# Patient Record
Sex: Male | Born: 1935 | Race: White | Hispanic: No | Marital: Married | Smoking: Former smoker
Health system: Southern US, Community
[De-identification: ages and names within clinical notes are randomized; demographics above are authoritative.]

## PROBLEM LIST (undated history)

## (undated) DIAGNOSIS — K219 Gastro-esophageal reflux disease without esophagitis: Secondary | ICD-10-CM

## (undated) DIAGNOSIS — M1711 Unilateral primary osteoarthritis, right knee: Secondary | ICD-10-CM

## (undated) DIAGNOSIS — N429 Disorder of prostate, unspecified: Secondary | ICD-10-CM

## (undated) DIAGNOSIS — K649 Unspecified hemorrhoids: Secondary | ICD-10-CM

## (undated) DIAGNOSIS — I10 Essential (primary) hypertension: Secondary | ICD-10-CM

## (undated) DIAGNOSIS — R339 Retention of urine, unspecified: Secondary | ICD-10-CM

## (undated) DIAGNOSIS — F5101 Primary insomnia: Secondary | ICD-10-CM

## (undated) DIAGNOSIS — I499 Cardiac arrhythmia, unspecified: Secondary | ICD-10-CM

## (undated) DIAGNOSIS — N529 Male erectile dysfunction, unspecified: Secondary | ICD-10-CM

## (undated) DIAGNOSIS — D075 Carcinoma in situ of prostate: Secondary | ICD-10-CM

## (undated) DIAGNOSIS — N401 Enlarged prostate with lower urinary tract symptoms: Secondary | ICD-10-CM

## (undated) DIAGNOSIS — C801 Malignant (primary) neoplasm, unspecified: Secondary | ICD-10-CM

## (undated) DIAGNOSIS — C44301 Unspecified malignant neoplasm of skin of nose: Secondary | ICD-10-CM

## (undated) DIAGNOSIS — E785 Hyperlipidemia, unspecified: Secondary | ICD-10-CM

## (undated) DIAGNOSIS — R972 Elevated prostate specific antigen [PSA]: Secondary | ICD-10-CM

## (undated) DIAGNOSIS — N138 Other obstructive and reflux uropathy: Secondary | ICD-10-CM

## (undated) HISTORY — DX: Gastro-esophageal reflux disease without esophagitis: K21.9

## (undated) HISTORY — DX: Other obstructive and reflux uropathy: N13.8

## (undated) HISTORY — DX: Primary insomnia: F51.01

## (undated) HISTORY — DX: Elevated prostate specific antigen (PSA): R97.20

## (undated) HISTORY — DX: Retention of urine, unspecified: R33.9

## (undated) HISTORY — DX: Carcinoma in situ of prostate: D07.5

## (undated) HISTORY — PX: TONSILLECTOMY: SUR1361

## (undated) HISTORY — PX: SQUAMOUS CELL CARCINOMA EXCISION: SHX2433

## (undated) HISTORY — DX: Unilateral primary osteoarthritis, right knee: M17.11

## (undated) HISTORY — DX: Male erectile dysfunction, unspecified: N52.9

## (undated) HISTORY — DX: Essential (primary) hypertension: I10

## (undated) HISTORY — PX: CHOLECYSTECTOMY: SHX55

## (undated) HISTORY — DX: Benign prostatic hyperplasia with lower urinary tract symptoms: N40.1

## (undated) HISTORY — DX: Unspecified malignant neoplasm of skin of nose: C44.301

---

## 2003-12-08 ENCOUNTER — Other Ambulatory Visit: Payer: Self-pay

## 2009-08-11 ENCOUNTER — Ambulatory Visit: Payer: Self-pay | Admitting: Otolaryngology

## 2009-11-23 ENCOUNTER — Ambulatory Visit: Payer: Self-pay | Admitting: Urology

## 2009-11-30 ENCOUNTER — Ambulatory Visit: Payer: Self-pay | Admitting: Urology

## 2012-01-01 ENCOUNTER — Ambulatory Visit: Payer: Self-pay | Admitting: Gastroenterology

## 2012-02-15 ENCOUNTER — Ambulatory Visit: Payer: Self-pay | Admitting: Gastroenterology

## 2013-08-01 DIAGNOSIS — N529 Male erectile dysfunction, unspecified: Secondary | ICD-10-CM

## 2013-08-01 DIAGNOSIS — D075 Carcinoma in situ of prostate: Secondary | ICD-10-CM

## 2013-08-01 DIAGNOSIS — C44301 Unspecified malignant neoplasm of skin of nose: Secondary | ICD-10-CM

## 2013-08-01 DIAGNOSIS — R972 Elevated prostate specific antigen [PSA]: Secondary | ICD-10-CM

## 2013-08-01 DIAGNOSIS — N138 Other obstructive and reflux uropathy: Secondary | ICD-10-CM

## 2013-08-01 DIAGNOSIS — Z87898 Personal history of other specified conditions: Secondary | ICD-10-CM | POA: Insufficient documentation

## 2013-08-01 HISTORY — DX: Unspecified malignant neoplasm of skin of nose: C44.301

## 2013-08-01 HISTORY — DX: Other obstructive and reflux uropathy: N13.8

## 2013-08-01 HISTORY — DX: Male erectile dysfunction, unspecified: N52.9

## 2013-08-01 HISTORY — DX: Carcinoma in situ of prostate: D07.5

## 2013-08-01 HISTORY — DX: Elevated prostate specific antigen (PSA): R97.20

## 2014-07-09 DIAGNOSIS — F5101 Primary insomnia: Secondary | ICD-10-CM | POA: Insufficient documentation

## 2014-07-09 DIAGNOSIS — I10 Essential (primary) hypertension: Secondary | ICD-10-CM

## 2014-07-09 DIAGNOSIS — E785 Hyperlipidemia, unspecified: Secondary | ICD-10-CM | POA: Insufficient documentation

## 2014-07-09 DIAGNOSIS — K219 Gastro-esophageal reflux disease without esophagitis: Secondary | ICD-10-CM

## 2014-07-09 HISTORY — DX: Essential (primary) hypertension: I10

## 2014-07-09 HISTORY — DX: Gastro-esophageal reflux disease without esophagitis: K21.9

## 2014-07-09 HISTORY — DX: Primary insomnia: F51.01

## 2015-09-14 DIAGNOSIS — R339 Retention of urine, unspecified: Secondary | ICD-10-CM

## 2015-09-14 HISTORY — DX: Retention of urine, unspecified: R33.9

## 2016-07-03 ENCOUNTER — Other Ambulatory Visit: Payer: Self-pay | Admitting: Gastroenterology

## 2016-07-03 DIAGNOSIS — R1310 Dysphagia, unspecified: Secondary | ICD-10-CM

## 2016-07-07 ENCOUNTER — Ambulatory Visit
Admission: RE | Admit: 2016-07-07 | Discharge: 2016-07-07 | Disposition: A | Discharge status: Home / Self Care | Payer: Medicare Other | Source: Ambulatory Visit | Attending: Gastroenterology | Admitting: Gastroenterology

## 2016-07-07 DIAGNOSIS — R1310 Dysphagia, unspecified: Secondary | ICD-10-CM

## 2016-07-07 DIAGNOSIS — K449 Diaphragmatic hernia without obstruction or gangrene: Secondary | ICD-10-CM | POA: Diagnosis not present

## 2016-08-24 ENCOUNTER — Encounter: Payer: Self-pay | Admitting: *Deleted

## 2016-08-25 ENCOUNTER — Ambulatory Visit: Payer: Medicare Other | Admitting: Anesthesiology

## 2016-08-25 ENCOUNTER — Ambulatory Visit
Admission: RE | Admit: 2016-08-25 | Discharge: 2016-08-25 | Disposition: A | Discharge status: Home / Self Care | Payer: Medicare Other | Source: Ambulatory Visit | Attending: Gastroenterology | Admitting: Gastroenterology

## 2016-08-25 ENCOUNTER — Encounter: Payer: Self-pay | Admitting: *Deleted

## 2016-08-25 ENCOUNTER — Encounter
Admission: RE | Disposition: A | Discharge status: Home / Self Care | Payer: Self-pay | Source: Ambulatory Visit | Attending: Gastroenterology

## 2016-08-25 DIAGNOSIS — Z79899 Other long term (current) drug therapy: Secondary | ICD-10-CM | POA: Insufficient documentation

## 2016-08-25 DIAGNOSIS — K219 Gastro-esophageal reflux disease without esophagitis: Secondary | ICD-10-CM | POA: Insufficient documentation

## 2016-08-25 DIAGNOSIS — K449 Diaphragmatic hernia without obstruction or gangrene: Secondary | ICD-10-CM | POA: Diagnosis not present

## 2016-08-25 DIAGNOSIS — E785 Hyperlipidemia, unspecified: Secondary | ICD-10-CM | POA: Diagnosis not present

## 2016-08-25 DIAGNOSIS — Q399 Congenital malformation of esophagus, unspecified: Secondary | ICD-10-CM | POA: Diagnosis not present

## 2016-08-25 DIAGNOSIS — Z87891 Personal history of nicotine dependence: Secondary | ICD-10-CM | POA: Insufficient documentation

## 2016-08-25 DIAGNOSIS — Z7982 Long term (current) use of aspirin: Secondary | ICD-10-CM | POA: Insufficient documentation

## 2016-08-25 DIAGNOSIS — K2950 Unspecified chronic gastritis without bleeding: Secondary | ICD-10-CM | POA: Diagnosis not present

## 2016-08-25 DIAGNOSIS — K224 Dyskinesia of esophagus: Secondary | ICD-10-CM | POA: Diagnosis not present

## 2016-08-25 DIAGNOSIS — K317 Polyp of stomach and duodenum: Secondary | ICD-10-CM | POA: Diagnosis not present

## 2016-08-25 DIAGNOSIS — R1013 Epigastric pain: Secondary | ICD-10-CM | POA: Diagnosis present

## 2016-08-25 HISTORY — DX: Disorder of prostate, unspecified: N42.9

## 2016-08-25 HISTORY — DX: Cardiac arrhythmia, unspecified: I49.9

## 2016-08-25 HISTORY — DX: Male erectile dysfunction, unspecified: N52.9

## 2016-08-25 HISTORY — DX: Gastro-esophageal reflux disease without esophagitis: K21.9

## 2016-08-25 HISTORY — DX: Unspecified hemorrhoids: K64.9

## 2016-08-25 HISTORY — DX: Malignant (primary) neoplasm, unspecified: C80.1

## 2016-08-25 HISTORY — PX: ESOPHAGOGASTRODUODENOSCOPY (EGD) WITH PROPOFOL: SHX5813

## 2016-08-25 HISTORY — DX: Hyperlipidemia, unspecified: E78.5

## 2016-08-25 SURGERY — ESOPHAGOGASTRODUODENOSCOPY (EGD) WITH PROPOFOL
Anesthesia: General

## 2016-08-25 MED ORDER — EPHEDRINE SULFATE-NACL 50-0.9 MG/10ML-% IV SOSY
PREFILLED_SYRINGE | INTRAVENOUS | Status: DC | PRN
  Administered 2016-08-25: 10 mg via INTRAVENOUS

## 2016-08-25 MED ORDER — PROPOFOL 10 MG/ML IV BOLUS
INTRAVENOUS | Status: DC | PRN
  Administered 2016-08-25 (×2): 20 mg via INTRAVENOUS
  Administered 2016-08-25: 80 mg via INTRAVENOUS
  Administered 2016-08-25: 10 mg via INTRAVENOUS

## 2016-08-25 MED ORDER — SODIUM CHLORIDE 0.9 % IV SOLN
INTRAVENOUS | Status: DC
  Administered 2016-08-25: 08:00:00 via INTRAVENOUS

## 2016-08-25 MED ORDER — FENTANYL CITRATE (PF) 100 MCG/2ML IJ SOLN
INTRAMUSCULAR | Status: DC | PRN
  Administered 2016-08-25: 50 ug via INTRAVENOUS

## 2016-08-25 MED ORDER — SODIUM CHLORIDE 0.9 % IV SOLN: INTRAVENOUS | Status: DC

## 2016-08-25 NOTE — H&P (Signed)
Outpatient short stay form Pre-procedure 08/25/2016 8:47 AM Russell Sails MD  Primary Physician: [name]  Reason for visit:  EGD  History of present illness:  Patient is a 80 year old male presenting today as above. He has a history of an atypical epigastric symptoms began this past August. This feels like a gnawing sensation not quite nausea. There has been some discomfort with it. He has had no emesis. Although he did undergo a barium swallow for dysphagia he denies this symptom currently. He has been taking a proton pump inhibitor, omeprazole, 40 mg a day and this has been started to help with the epigastric discomfort as well. He's had no weight loss. His appetite is been good. His barium swallow done on 07/07/2016 showed a normal passage of barium tablet. There were prominent tertiary esophageal contractions consistent with presbyesophagus. A small hiatal hernia and no reflux no obstruction and no evidence of a fixed stricture.    Current Facility-Administered Medications:  .  0.9 %  sodium chloride infusion, , Intravenous, Continuous, Russell Sails, MD, Last Rate: 20 mL/hr at 08/25/16 0825 .  0.9 %  sodium chloride infusion, , Intravenous, Continuous, Russell Sails, MD  Prescriptions Prior to Admission  Medication Sig Dispense Refill Last Dose  . aspirin EC 81 MG tablet Take 81 mg by mouth daily.   08/20/2016  . dutasteride (AVODART) 0.5 MG capsule Take 0.5 mg by mouth daily.   08/24/2016 at Unknown time  . losartan (COZAAR) 50 MG tablet Take 50 mg by mouth daily.   08/25/2016 at 0500  . niacin (NIASPAN) 1000 MG CR tablet Take 1,000 mg by mouth at bedtime.   08/24/2016 at Unknown time  . omeprazole (PRILOSEC) 20 MG capsule Take 20 mg by mouth 2 (two) times daily before a meal.   08/24/2016 at Unknown time     No Known Allergies   Past Medical History:  Diagnosis Date  . Benign prostatic disease   . Cancer ([place])   . Dysrhythmia   . Erectile dysfunction    . GERD (gastroesophageal reflux disease)   . Hemorrhoids   . Hyperlipidemia     Review of systems:      Physical Exam    Heart and lungs: Regular rate and rhythm without rub or gallop, lungs are bilaterally clear.    HEENT: Normocephalic atraumatic eyes are anicteric    Other:     Pertinant exam for procedure: Soft nontender nondistended bowel sounds positive normoactive.    Planned proceedures: EGD and indicated procedures. I have discussed the risks benefits and complications of procedures to include not limited to bleeding, infection, perforation and the risk of sedation and the patient wishes to proceed.    Russell Sails, MD Gastroenterology 08/25/2016  8:47 AM

## 2016-08-25 NOTE — Op Note (Signed)
[facility] Gastroenterology Patient Name: Russell Jordan Procedure Date: 08/25/2016 8:47 AM MRN: [id number] Account #: [id number] Date of Birth: March 18, 1936 Admit Type: Outpatient Age: 80 Room: [facility] ENDO ROOM 1 Gender: Male Note Status: Finalized Procedure:            Upper GI endoscopy Indications:          Epigastric abdominal pain, Dyspepsia, Dysphagia,                        Failure to respond to medical treatment Providers:            [name], MD Referring MD:         No Local Md, MD (Referring MD) Medicines:            Monitored Anesthesia Care Complications:        No immediate complications. Procedure:            Pre-Anesthesia Assessment:                       - ASA Grade Assessment: III - A patient with severe                        systemic disease.                       After obtaining informed consent, the endoscope was                        passed under direct vision. Throughout the procedure,                        the patient's blood pressure, pulse, and oxygen                        saturations were monitored continuously. The Endoscope                        was introduced through the mouth, and advanced to the                        third part of duodenum. The upper GI endoscopy was                        accomplished without difficulty. The patient tolerated                        the procedure well. The patient tolerated the procedure                        well. Findings:      The Z-line was variable. Biopsies were taken with a cold forceps for       histology.      The lower third of the esophagus was significantly tortuous.      Abnormal motility was noted in the middle third of the esophagus and in       the lower third of the esophagus. The cricopharyngeus was normal. There       are extra peristaltic waves in the esophageal body. The distal       esophagus/lower esophageal sphincter is open. Tertiary peristaltic waves   are noted.  A single 1 mm sessile polyp with no bleeding and no stigmata of recent       bleeding was found in the gastric body. The polyp was removed with a       cold biopsy forceps. Resection and retrieval were complete.      A benign smooth appearing polyp is noted just below the GE junction in       the cardia, fundic gland in appearance that I could not access in       forward or retroflex position.      The exam of the stomach was otherwise normal.      Biopsies were taken with a cold forceps in the gastric body and in the       gastric antrum for histology.      Patchy mild mucosal variance characterized by smoothness and altered       texture was found in the second portion of the duodenum and in the third       portion of the duodenum. Biopsies were taken with a cold forceps for       histology. Impression:           - Z-line variable. Biopsied.                       - Tortuous esophagus.                       - Abnormal esophageal motility, consistent with                        presbyesophagus.                       - A single gastric polyp. Resected and retrieved.                       - Mucosal variant in the duodenum. Biopsied.                       - Biopsies were taken with a cold forceps for histology                        in the gastric body and in the gastric antrum. Recommendation:       - Discharge patient to home.                       - Return to GI clinic in 3 weeks.                       - Continue present medications. Procedure Code(s):    --- Professional ---                       [phone], Esophagogastroduodenoscopy, flexible, transoral;                        with biopsy, single or multiple Diagnosis Code(s):    --- Professional ---                       K22.8, Other specified diseases of esophagus                       Q39.9, Congenital malformation  of esophagus, unspecified                       K22.4, Dyskinesia of esophagus                        K31.7, Polyp of stomach and duodenum                       K31.89, Other diseases of stomach and duodenum                       R10.13, Epigastric pain                       R13.10, Dysphagia, unspecified CPT copyright 2016 American Medical Association. All rights reserved. The codes documented in this report are preliminary and upon coder review may  be revised to meet current compliance requirements. [name], MD 08/25/2016 9:18:28 AM This report has been signed electronically. Number of Addenda: 0 Note Initiated On: 08/25/2016 8:47 AM      [facility]

## 2016-08-25 NOTE — Transfer of Care (Signed)
Immediate Anesthesia Transfer of Care Note  Patient: Russell Jordan  Procedure(s) Performed: Procedure(s): ESOPHAGOGASTRODUODENOSCOPY (EGD) WITH PROPOFOL (N/A)  Patient Location: PACU and Endoscopy Unit  Anesthesia Type:General  Level of Consciousness: awake, alert  and oriented  Airway & Oxygen Therapy: Patient Spontanous Breathing and Patient connected to nasal cannula oxygen  Post-op Assessment: Report given to RN and Post -op Vital signs reviewed and stable  Post vital signs: Reviewed and stable  Last Vitals:  Vitals:   08/25/16 0914 08/25/16 0915  BP: (!) 105/40 (!) 105/40  Pulse:  68  Resp:  18  Temp: 36.4 C     Last Pain:  Vitals:   08/25/16 0806  TempSrc: Oral         Complications: No apparent anesthesia complications

## 2016-08-25 NOTE — Anesthesia Postprocedure Evaluation (Signed)
Anesthesia Post Note  Patient: Russell Jordan  Procedure(s) Performed: Procedure(s) (LRB): ESOPHAGOGASTRODUODENOSCOPY (EGD) WITH PROPOFOL (N/A)  Patient location during evaluation: Endoscopy Anesthesia Type: General Level of consciousness: awake and alert Pain management: pain level controlled Vital Signs Assessment: post-procedure vital signs reviewed and stable Respiratory status: spontaneous breathing and respiratory function stable Cardiovascular status: stable Anesthetic complications: no    Last Vitals:  Vitals:   08/25/16 0940 08/25/16 0950  BP: (!) 151/67 (!) 145/59  Pulse:  66  Resp:  13  Temp:      Last Pain:  Vitals:   08/25/16 0806  TempSrc: Oral                 Kenesha Moshier K

## 2016-08-25 NOTE — Anesthesia Preprocedure Evaluation (Signed)
Anesthesia Evaluation  Patient identified by MRN, date of birth, ID band Patient awake    Reviewed: Allergy & Precautions, NPO status , Patient's Chart, lab work & pertinent test results  History of Anesthesia Complications Negative for: history of anesthetic complications  Airway Mallampati: I       Dental   Pulmonary neg pulmonary ROS, former smoker,           Cardiovascular negative cardio ROS       Neuro/Psych negative neurological ROS     GI/Hepatic Neg liver ROS, GERD  Medicated,  Endo/Other  negative endocrine ROS  Renal/GU negative Renal ROS     Musculoskeletal   Abdominal   Peds  Hematology negative hematology ROS (+)   Anesthesia Other Findings   Reproductive/Obstetrics                             Anesthesia Physical Anesthesia Plan  ASA: II  Anesthesia Plan: General   Post-op Pain Management:    Induction:   Airway Management Planned: Nasal Cannula  Additional Equipment:   Intra-op Plan:   Post-operative Plan:   Informed Consent: I have reviewed the patients History and Physical, chart, labs and discussed the procedure including the risks, benefits and alternatives for the proposed anesthesia with the patient or authorized representative who has indicated his/her understanding and acceptance.     Plan Discussed with:   Anesthesia Plan Comments:         Anesthesia Quick Evaluation

## 2016-08-28 ENCOUNTER — Encounter: Payer: Self-pay | Admitting: Gastroenterology

## 2016-08-28 LAB — SURGICAL PATHOLOGY

## 2016-10-27 ENCOUNTER — Ambulatory Visit: Admit: 2016-10-27 | Payer: Medicare Other | Admitting: Gastroenterology

## 2016-10-27 SURGERY — ESOPHAGOGASTRODUODENOSCOPY (EGD) WITH PROPOFOL
Anesthesia: General

## 2016-11-27 ENCOUNTER — Ambulatory Visit
Admission: RE | Admit: 2016-11-27 | Discharge: 2016-11-27 | Disposition: A | Discharge status: Home / Self Care | Payer: Medicare Other | Source: Ambulatory Visit | Attending: Family Medicine | Admitting: Family Medicine

## 2016-11-27 ENCOUNTER — Other Ambulatory Visit: Payer: Self-pay | Admitting: Family Medicine

## 2016-11-27 DIAGNOSIS — M79604 Pain in right leg: Secondary | ICD-10-CM

## 2017-01-24 DIAGNOSIS — M1711 Unilateral primary osteoarthritis, right knee: Secondary | ICD-10-CM

## 2017-01-24 DIAGNOSIS — M25561 Pain in right knee: Secondary | ICD-10-CM | POA: Insufficient documentation

## 2017-01-24 HISTORY — DX: Unilateral primary osteoarthritis, right knee: M17.11

## 2018-06-17 ENCOUNTER — Other Ambulatory Visit: Payer: Self-pay | Admitting: Family Medicine

## 2018-06-17 MED ORDER — DUTASTERIDE 0.5 MG PO CAPS: 0.5000 mg | ORAL_CAPSULE | Freq: Every day | ORAL | 0 refills | Status: DC

## 2018-08-07 ENCOUNTER — Ambulatory Visit (INDEPENDENT_AMBULATORY_CARE_PROVIDER_SITE_OTHER): Payer: Medicare Other | Admitting: Urology

## 2018-08-07 ENCOUNTER — Encounter: Payer: Self-pay | Admitting: Urology

## 2018-08-07 VITALS — BP 163/66 | HR 67 | Ht 67.0 in | Wt 153.0 lb

## 2018-08-07 DIAGNOSIS — N4231 Prostatic intraepithelial neoplasia: Secondary | ICD-10-CM

## 2018-08-07 DIAGNOSIS — N138 Other obstructive and reflux uropathy: Secondary | ICD-10-CM

## 2018-08-07 DIAGNOSIS — Z87898 Personal history of other specified conditions: Secondary | ICD-10-CM

## 2018-08-07 DIAGNOSIS — N401 Enlarged prostate with lower urinary tract symptoms: Secondary | ICD-10-CM

## 2018-08-07 MED ORDER — DUTASTERIDE 0.5 MG PO CAPS: 0.5000 mg | ORAL_CAPSULE | Freq: Every day | ORAL | 0 refills | Status: DC

## 2018-08-07 MED ORDER — TAMSULOSIN HCL 0.4 MG PO CAPS: 0.4000 mg | ORAL_CAPSULE | Freq: Every day | ORAL | 3 refills | Status: DC

## 2018-08-07 MED ORDER — SILDENAFIL CITRATE 20 MG PO TABS: ORAL_TABLET | ORAL | 0 refills | Status: DC

## 2018-08-07 NOTE — Progress Notes (Signed)
08/07/2018 11:01 AM   Russell Jordan Aug 16, 1936 [id number]  Referring provider: Sherrin Daisy, MD No address on file  Chief Complaint  Patient presents with  . New Patient (Initial Visit)    transfer of care   Urologic history: 1.  History elevated PSA -Initial biopsy 2004 for abnormal PSA velocity 3.9; benign -Biopsy 08/2005 PSA 8.3; focus high-grade PIN -Follow-up biopsy 2007; benign -Opted for repeat biopsy April 2008 PSA is 7.9; benign; prostate volume 68.4 cc  2.  History gross hematuria -Evaluation 2011 and 2018 with negative upper tract imaging; cystoscopy remarkable for BPH  3.  BPH with lower urinary tract symptoms -Combination therapy with tamsulosin/dutasteride  4.  Erectile dysfunction -Generic sildenafil  HPI: 82 year old male presents for follow-up.  I last saw him in September 2018 for cystoscopy as part of a hematuria evaluation.  He did see Dr. Jacqlyn Larsen in January 2019 at [facility] and his uncorrected PSA was stable at 1.6.  He has mild urinary symptoms of frequency and nocturia x2.  Denies dysuria or gross hematuria.  Denies flank, abdominal, pelvic or scrotal pain.   PMH: Past Medical History:  Diagnosis Date  . Benign prostatic disease   . Benign prostatic hyperplasia with urinary obstruction 08/01/2013  . Cancer ([place])   . Carcinoma in situ of prostate 08/01/2013  . Dysrhythmia   . ED (erectile dysfunction) of organic origin 08/01/2013  . Elevated prostate specific antigen (PSA) 08/01/2013  . Erectile dysfunction   . Essential hypertension 07/09/2014  . Gastro-esophageal reflux disease without esophagitis 07/09/2014  . GERD (gastroesophageal reflux disease)   . Hemorrhoids   . Hyperlipidemia   . Incomplete emptying of bladder 09/14/2015  . Malignant neoplasm of skin of nose 08/01/2013   Overview:  IMO Problem List Replacer Jan. 2016  . Primary insomnia 07/09/2014  . Primary osteoarthritis of right knee 01/24/2017    Surgical History: Past Surgical  History:  Procedure Laterality Date  . CHOLECYSTECTOMY    . ESOPHAGOGASTRODUODENOSCOPY (EGD) WITH PROPOFOL N/A 08/25/2016   Procedure: ESOPHAGOGASTRODUODENOSCOPY (EGD) WITH PROPOFOL;  Surgeon: [name], MD;  Location: [facility] ENDOSCOPY;  Service: Endoscopy;  Laterality: N/A;  . SQUAMOUS CELL CARCINOMA EXCISION    . TONSILLECTOMY      Home Medications:  Allergies as of 08/07/2018   No Known Allergies     Medication List        Accurate as of 08/07/18 11:01 AM. Always use your most recent med list.          aspirin EC 81 MG tablet Take 81 mg by mouth daily.   dutasteride 0.5 MG capsule Commonly known as:  AVODART Take 1 capsule (0.5 mg total) by mouth daily.   losartan 50 MG tablet Commonly known as:  COZAAR Take 50 mg by mouth daily.   niacin 1000 MG CR tablet Commonly known as:  NIASPAN Take 1,000 mg by mouth at bedtime.   omeprazole 20 MG capsule Commonly known as:  PRILOSEC Take 20 mg by mouth 2 (two) times daily before a meal.   tamsulosin 0.4 MG Caps capsule Commonly known as:  FLOMAX   traZODone 50 MG tablet Commonly known as:  DESYREL Take by mouth.       Allergies: No Known Allergies  Family History: History reviewed. No pertinent family history.  Social History:  reports that he has quit smoking. He has never used smokeless tobacco. He reports that he does not drink alcohol or use drugs.  ROS: UROLOGY Frequent Urination?: Yes Hard  to postpone urination?: No Burning/pain with urination?: No Get up at night to urinate?: Yes Leakage of urine?: No Urine stream starts and stops?: No Trouble starting stream?: No Do you have to strain to urinate?: No Blood in urine?: No Urinary tract infection?: No Sexually transmitted disease?: No Injury to kidneys or bladder?: No Painful intercourse?: No Weak stream?: No Erection problems?: Yes Penile pain?: No  Gastrointestinal Nausea?: No Vomiting?: No Indigestion/heartburn?: No Diarrhea?:  No Constipation?: No  Constitutional Fever: No Night sweats?: No Weight loss?: No Fatigue?: No  Skin Skin rash/lesions?: No Itching?: No  Eyes Blurred vision?: No Double vision?: No  Ears/Nose/Throat Sore throat?: No Sinus problems?: No  Hematologic/Lymphatic Swollen glands?: No Easy bruising?: Yes  Cardiovascular Leg swelling?: No Chest pain?: No  Respiratory Cough?: No Shortness of breath?: No  Endocrine Excessive thirst?: No  Musculoskeletal Back pain?: No Joint pain?: No  Neurological Headaches?: No Dizziness?: No  Psychologic Depression?: No Anxiety?: No  Physical Exam: BP (!) 163/66   Pulse 67   Ht 5\' 7"  (1.702 m)   Wt 153 lb (69.4 kg)   BMI 23.96 kg/m   Constitutional:  Alert and oriented, No acute distress. HEENT: [place] AT, moist mucus membranes.  Trachea midline, no masses. Cardiovascular: No clubbing, cyanosis, or edema. Respiratory: Normal respiratory effort, no increased work of breathing. GI: Abdomen is soft, nontender, nondistended, no abdominal masses GU: No CVA tenderness.  Prostate 50 g, smooth without nodules Lymph: No cervical or inguinal lymphadenopathy. Skin: No rashes, bruises or suspicious lesions. Neurologic: Grossly intact, no focal deficits, moving all 4 extremities. Psychiatric: Normal mood and affect.   Assessment & Plan:   82 year old male with a history of an elevated PSA.  His corrected PSA has been normal for the past several years.  I did discuss AUA prostate cancer screening guidelines and recommended he discontinue prostate cancer screening.  He requested to continue annual testing.  A PSA was ordered for after the first of the year.  He has stable lower urinary tract symptoms and erectile dysfunction.  Tamsulosin, dutasteride and sildenafil were refilled.  Continue annual follow-up.   Abbie Sons, [facility] [address] [place], [place] [address] [phone]

## 2018-11-06 ENCOUNTER — Other Ambulatory Visit: Payer: Medicare Other

## 2018-11-08 ENCOUNTER — Other Ambulatory Visit: Payer: Medicare Other

## 2018-11-08 DIAGNOSIS — Z87898 Personal history of other specified conditions: Secondary | ICD-10-CM

## 2018-11-08 DIAGNOSIS — N4231 Prostatic intraepithelial neoplasia: Secondary | ICD-10-CM

## 2018-11-08 DIAGNOSIS — N138 Other obstructive and reflux uropathy: Secondary | ICD-10-CM

## 2018-11-08 DIAGNOSIS — N401 Enlarged prostate with lower urinary tract symptoms: Secondary | ICD-10-CM

## 2018-11-09 LAB — PSA: Prostate Specific Ag, Serum: 1.4 ng/mL (ref 0.0–4.0)

## 2018-11-11 ENCOUNTER — Telehealth: Payer: Self-pay

## 2018-11-11 NOTE — Telephone Encounter (Signed)
-----   Message from Abbie Sons, MD sent at 11/11/2018  1:12 AM EST ----- PSA stable at 1.4.  Follow-up as scheduled

## 2018-11-11 NOTE — Telephone Encounter (Signed)
Called pt informed him of the information below. Pt gave verbal understanding.  

## 2018-11-15 ENCOUNTER — Ambulatory Visit: Payer: Self-pay | Admitting: Urology

## 2018-12-23 ENCOUNTER — Other Ambulatory Visit (HOSPITAL_COMMUNITY): Payer: Self-pay | Admitting: Ophthalmology

## 2018-12-23 ENCOUNTER — Other Ambulatory Visit: Payer: Self-pay | Admitting: Ophthalmology

## 2018-12-23 ENCOUNTER — Other Ambulatory Visit
Admission: RE | Admit: 2018-12-23 | Discharge: 2018-12-23 | Disposition: A | Discharge status: Home / Self Care | Payer: Medicare Other | Source: Ambulatory Visit | Attending: Ophthalmology | Admitting: Ophthalmology

## 2018-12-23 DIAGNOSIS — G453 Amaurosis fugax: Secondary | ICD-10-CM | POA: Diagnosis present

## 2018-12-23 LAB — CBC WITH DIFFERENTIAL/PLATELET
Abs Immature Granulocytes: 0.01 10*3/uL (ref 0.00–0.07)
BASOS ABS: 0.1 10*3/uL (ref 0.0–0.1)
BASOS PCT: 1 %
EOS ABS: 0.2 10*3/uL (ref 0.0–0.5)
EOS PCT: 4 %
HCT: 40.7 % (ref 39.0–52.0)
Hemoglobin: 13.6 g/dL (ref 13.0–17.0)
IMMATURE GRANULOCYTES: 0 %
LYMPHS ABS: 1.6 10*3/uL (ref 0.7–4.0)
Lymphocytes Relative: 25 %
MCH: 31.2 pg (ref 26.0–34.0)
MCHC: 33.4 g/dL (ref 30.0–36.0)
MCV: 93.3 fL (ref 80.0–100.0)
Monocytes Absolute: 0.8 10*3/uL (ref 0.1–1.0)
Monocytes Relative: 12 %
NEUTROS PCT: 58 %
NRBC: 0 % (ref 0.0–0.2)
Neutro Abs: 3.7 10*3/uL (ref 1.7–7.7)
PLATELETS: 195 10*3/uL (ref 150–400)
RBC: 4.36 MIL/uL (ref 4.22–5.81)
RDW: 13.1 % (ref 11.5–15.5)
WBC: 6.4 10*3/uL (ref 4.0–10.5)

## 2018-12-23 LAB — C-REACTIVE PROTEIN

## 2018-12-23 LAB — SEDIMENTATION RATE: SED RATE: 5 mm/h (ref 0–20)

## 2019-01-02 ENCOUNTER — Ambulatory Visit: Payer: Medicare Other

## 2019-03-12 ENCOUNTER — Telehealth: Payer: Self-pay | Admitting: Urology

## 2019-03-12 DIAGNOSIS — N138 Other obstructive and reflux uropathy: Secondary | ICD-10-CM

## 2019-03-12 DIAGNOSIS — N401 Enlarged prostate with lower urinary tract symptoms: Secondary | ICD-10-CM

## 2019-03-12 MED ORDER — DUTASTERIDE 0.5 MG PO CAPS: 0.5000 mg | ORAL_CAPSULE | Freq: Every day | ORAL | 0 refills | Status: DC

## 2019-03-12 MED ORDER — TAMSULOSIN HCL 0.4 MG PO CAPS: 0.4000 mg | ORAL_CAPSULE | Freq: Every day | ORAL | 0 refills | Status: DC

## 2019-03-12 NOTE — Telephone Encounter (Signed)
Pt needs a 90 day refill (he will run about before next appt w/[facility] in October) for Dutasderide and Tamsulosin sent to mail order [facility], through [place] [phone]

## 2019-03-12 NOTE — Telephone Encounter (Signed)
RX sent with appropriate refills.

## 2019-07-31 ENCOUNTER — Other Ambulatory Visit: Payer: Self-pay | Admitting: Gastroenterology

## 2019-07-31 DIAGNOSIS — R1013 Epigastric pain: Secondary | ICD-10-CM

## 2019-08-06 ENCOUNTER — Other Ambulatory Visit: Payer: Self-pay

## 2019-08-06 ENCOUNTER — Ambulatory Visit
Admission: RE | Admit: 2019-08-06 | Discharge: 2019-08-06 | Disposition: A | Discharge status: Home / Self Care | Payer: Medicare Other | Source: Ambulatory Visit | Attending: Gastroenterology | Admitting: Gastroenterology

## 2019-08-06 DIAGNOSIS — R1013 Epigastric pain: Secondary | ICD-10-CM | POA: Diagnosis present

## 2019-08-08 ENCOUNTER — Ambulatory Visit (INDEPENDENT_AMBULATORY_CARE_PROVIDER_SITE_OTHER): Payer: Medicare Other | Admitting: Urology

## 2019-08-08 ENCOUNTER — Encounter: Payer: Self-pay | Admitting: Urology

## 2019-08-08 ENCOUNTER — Other Ambulatory Visit: Payer: Self-pay

## 2019-08-08 VITALS — BP 186/72 | HR 62 | Ht 66.0 in | Wt 152.1 lb

## 2019-08-08 DIAGNOSIS — Z87898 Personal history of other specified conditions: Secondary | ICD-10-CM | POA: Diagnosis not present

## 2019-08-08 DIAGNOSIS — N401 Enlarged prostate with lower urinary tract symptoms: Secondary | ICD-10-CM

## 2019-08-08 DIAGNOSIS — N138 Other obstructive and reflux uropathy: Secondary | ICD-10-CM | POA: Diagnosis not present

## 2019-08-08 MED ORDER — DUTASTERIDE 0.5 MG PO CAPS: 0.5000 mg | ORAL_CAPSULE | Freq: Every day | ORAL | 3 refills | Status: DC

## 2019-08-08 MED ORDER — TAMSULOSIN HCL 0.4 MG PO CAPS: 0.4000 mg | ORAL_CAPSULE | Freq: Every day | ORAL | 3 refills | Status: DC

## 2019-08-08 NOTE — Progress Notes (Signed)
08/08/2019 10:34 AM   Russell Jordan [birth year] [id number]  Referring provider: Sofie Hartigan, MD [place],  [place] [address]  Chief Complaint  Patient presents with  . Follow-up    Urologic history: 1.  History elevated PSA -Initial biopsy 2004 for abnormal PSA velocity 3.9; benign -Biopsy 08/2005 PSA 8.3; focus high-grade PIN -Follow-up biopsy 2007; benign -Opted for repeat biopsy April 2008 PSA is 7.9; benign; prostate volume 68.4 cc  2.  History gross hematuria -Evaluation 2011 and 2018 with negative upper tract imaging; cystoscopy remarkable for BPH  3.  BPH with lower urinary tract symptoms -Combination therapy with tamsulosin/dutasteride   HPI: 83 y.o. male presents for annual follow-up.  He has stable, moderate lower urinary tract symptoms with his most bothersome symptoms being frequency, urgency and sensation of incomplete emptying.  He has nocturia x2.  IPSS completed today was 16/35 with a QoL rated 3/6.  Denies dysuria or gross hematuria.  No flank, abdominal or pelvic pain.   PMH: Past Medical History:  Diagnosis Date  . Benign prostatic disease   . Benign prostatic hyperplasia with urinary obstruction 08/01/2013  . Cancer ([place])   . Carcinoma in situ of prostate 08/01/2013  . Dysrhythmia   . ED (erectile dysfunction) of organic origin 08/01/2013  . Elevated prostate specific antigen (PSA) 08/01/2013  . Erectile dysfunction   . Essential hypertension 07/09/2014  . Gastro-esophageal reflux disease without esophagitis 07/09/2014  . GERD (gastroesophageal reflux disease)   . Hemorrhoids   . Hyperlipidemia   . Incomplete emptying of bladder 09/14/2015  . Malignant neoplasm of skin of nose 08/01/2013   Overview:  IMO Problem List Replacer Jan. 2016  . Primary insomnia 07/09/2014  . Primary osteoarthritis of right knee 01/24/2017    Surgical History: Past Surgical History:  Procedure Laterality Date  . CHOLECYSTECTOMY    .  ESOPHAGOGASTRODUODENOSCOPY (EGD) WITH PROPOFOL N/A 08/25/2016   Procedure: ESOPHAGOGASTRODUODENOSCOPY (EGD) WITH PROPOFOL;  Surgeon: [name], MD;  Location: [facility] ENDOSCOPY;  Service: Endoscopy;  Laterality: N/A;  . SQUAMOUS CELL CARCINOMA EXCISION    . TONSILLECTOMY      Home Medications:  Allergies as of 08/08/2019   No Known Allergies     Medication List       Accurate as of August 08, 2019 10:34 AM. If you have any questions, ask your nurse or doctor.        aspirin EC 81 MG tablet Take 81 mg by mouth daily.   dutasteride 0.5 MG capsule Commonly known as: AVODART Take 1 capsule (0.5 mg total) by mouth daily.   losartan 50 MG tablet Commonly known as: COZAAR Take 50 mg by mouth daily.   niacin 1000 MG CR tablet Commonly known as: NIASPAN Take 1,000 mg by mouth at bedtime.   omeprazole 40 MG capsule Commonly known as: PRILOSEC Take 40 mg by mouth daily.   pantoprazole 40 MG tablet Commonly known as: PROTONIX   SENNA LAXATIVE PO Take by mouth.   sildenafil 20 MG tablet Commonly known as: REVATIO 2-5 tabs 1 hour prior to intercourse   SSD 1 % cream Generic drug: silver sulfADIAZINE APPLY CREAM EXTERNALLY TWICE DAILY UNTIL HEALED   tamsulosin 0.4 MG Caps capsule Commonly known as: FLOMAX Take 1 capsule (0.4 mg total) by mouth daily.   TRAZODONE HCL PO Take 25 mg by mouth.   valACYclovir 1000 MG tablet Commonly known as: VALTREX Take 1,000 mg by mouth 3 (three) times daily.  Allergies: No Known Allergies  Family History: No family history on file.  Social History:  reports that he has quit smoking. He has never used smokeless tobacco. He reports that he does not drink alcohol or use drugs.  ROS: UROLOGY Frequent Urination?: No Hard to postpone urination?: No Burning/pain with urination?: No Get up at night to urinate?: No Leakage of urine?: No Urine stream starts and stops?: No Trouble starting stream?: No Do you have to  strain to urinate?: No Blood in urine?: No Urinary tract infection?: No Sexually transmitted disease?: No Injury to kidneys or bladder?: No Painful intercourse?: No Weak stream?: No Erection problems?: No Penile pain?: No  Gastrointestinal Nausea?: No Vomiting?: No Indigestion/heartburn?: No Diarrhea?: No Constipation?: No  Constitutional Fever: No Night sweats?: No Weight loss?: No Fatigue?: No  Skin Skin rash/lesions?: No Itching?: No  Eyes Blurred vision?: No Double vision?: No  Ears/Nose/Throat Sore throat?: No Sinus problems?: No  Hematologic/Lymphatic Swollen glands?: No Easy bruising?: Yes  Cardiovascular Leg swelling?: No Chest pain?: No  Respiratory Cough?: No Shortness of breath?: No  Endocrine Excessive thirst?: No  Musculoskeletal Back pain?: No Joint pain?: No  Neurological Headaches?: No Dizziness?: No  Psychologic Depression?: No Anxiety?: No  Physical Exam: BP (!) 186/72 (BP Location: Left Arm, Patient Position: Sitting, Cuff Size: Normal)   Pulse 62   Ht 5\' 6"  (1.676 m)   Wt 152 lb 1.6 oz (69 kg)   BMI 24.55 kg/m   Constitutional:  Alert and oriented, No acute distress. HEENT: [place] AT, moist mucus membranes.  Trachea midline, no masses. Cardiovascular: No clubbing, cyanosis, or edema. Respiratory: Normal respiratory effort, no increased work of breathing. GI: Abdomen is soft, nontender, nondistended, no abdominal masses GU: Prostate 50 g, smooth without nodules. Lymph: No cervical or inguinal lymphadenopathy. Skin: No rashes, bruises or suspicious lesions. Neurologic: Grossly intact, no focal deficits, moving all 4 extremities. Psychiatric: Normal mood and affect.    Assessment & Plan:    - BPH with lower urinary tract symptoms Moderate lower urinary tract symptoms on combination therapy.  He has expressed some interest in [facility].  The procedure was discussed.  If interested in pursuing will need to schedule TRUS and  cystoscopy.  He was provided literature.  Tamsulosin and dutasteride were refilled.  - History elevated PSA We have discussed AUA prostate cancer screening recommendations however he has requested to continue PSA testing.  His PSA was checked in January 2020 and he will return in late January for a PSA.  Will schedule his annual follow-up January 2021  Abbie Sons, [facility] [address], [place], [place] [address] [phone]

## 2019-08-09 ENCOUNTER — Encounter: Payer: Self-pay | Admitting: Urology

## 2019-08-09 MED ORDER — DUTASTERIDE 0.5 MG PO CAPS: 0.5000 mg | ORAL_CAPSULE | Freq: Every day | ORAL | 3 refills | Status: DC

## 2019-08-11 ENCOUNTER — Telehealth: Payer: Self-pay

## 2019-08-11 NOTE — Telephone Encounter (Signed)
-----   Message from Abbie Sons, MD sent at 08/09/2019 12:45 PM EDT ----- Regarding: Rx Patient wanted the dutasteride to be sent to [facility].  Please cancel the Rx that was sent to [facility].  Thanks

## 2019-10-23 ENCOUNTER — Other Ambulatory Visit: Payer: Medicare Other

## 2019-10-23 ENCOUNTER — Other Ambulatory Visit: Payer: Self-pay

## 2019-10-23 DIAGNOSIS — N4231 Prostatic intraepithelial neoplasia: Secondary | ICD-10-CM

## 2019-10-23 DIAGNOSIS — Z87898 Personal history of other specified conditions: Secondary | ICD-10-CM

## 2019-10-23 DIAGNOSIS — N138 Other obstructive and reflux uropathy: Secondary | ICD-10-CM

## 2019-10-23 DIAGNOSIS — N401 Enlarged prostate with lower urinary tract symptoms: Secondary | ICD-10-CM

## 2019-10-24 ENCOUNTER — Telehealth: Payer: Self-pay | Admitting: *Deleted

## 2019-10-24 LAB — PSA: Prostate Specific Ag, Serum: 1.4 ng/mL (ref 0.0–4.0)

## 2019-10-24 NOTE — Telephone Encounter (Signed)
-----   Message from Abbie Sons, MD sent at 10/24/2019  8:24 AM EST ----- PSA stable at 1.4

## 2019-10-24 NOTE — Telephone Encounter (Signed)
Notified patient as instructed,Left message for on vocal mail

## 2020-07-22 ENCOUNTER — Other Ambulatory Visit: Payer: Self-pay | Admitting: Urology

## 2020-07-22 DIAGNOSIS — N138 Other obstructive and reflux uropathy: Secondary | ICD-10-CM

## 2020-07-22 DIAGNOSIS — N401 Enlarged prostate with lower urinary tract symptoms: Secondary | ICD-10-CM

## 2020-07-28 ENCOUNTER — Telehealth: Payer: Self-pay | Admitting: Urology

## 2020-07-28 DIAGNOSIS — N401 Enlarged prostate with lower urinary tract symptoms: Secondary | ICD-10-CM

## 2020-07-28 DIAGNOSIS — N138 Other obstructive and reflux uropathy: Secondary | ICD-10-CM

## 2020-07-28 MED ORDER — TAMSULOSIN HCL 0.4 MG PO CAPS: 0.4000 mg | ORAL_CAPSULE | Freq: Every day | ORAL | 1 refills | Status: DC

## 2020-07-28 NOTE — Telephone Encounter (Signed)
Patient called the office today.  He states that the [facility] has not received the prescription that was sent on 07/23/2020.  I advised that patient that we were having trouble with electronic prescriptions on Friday.   Please call the pharmacy and do a verbal prescription order so patient can get his medication.  Thank you.

## 2020-07-28 NOTE — Telephone Encounter (Signed)
Medication refill has been faxed again.

## 2020-08-18 IMAGING — RF DG UGI W/ HIGH DENSITY W/O KUB
14 of 21 series · 14 of 21 positions shown · non-contrast
Comparison: None.

CLINICAL DATA: Abdominal pain 6 months improving.

EXAM:
UPPER GI SERIES WITHOUT KUB
TECHNIQUE: Routine upper GI series was performed with thin and thick barium.
FLUOROSCOPY TIME:  Fluoroscopy Time:  5 minutes 2 seconds
Radiation Exposure Index (if provided by the fluoroscopic device):
114.7 mGy
Number of Acquired Spot Images: 21

[Series 1: fluoro_barium 2fps_bw · 0.17mm/px · 1 of 1 slices shown (1 of 14)]
[im 1/1]
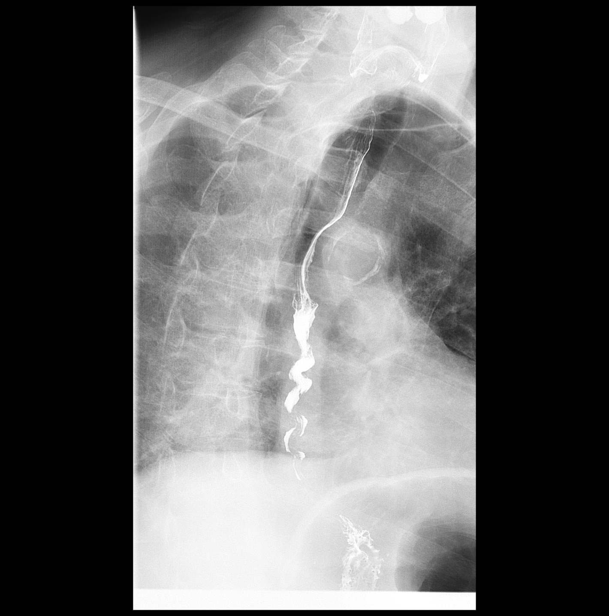

[Series 3: fluoro_barium 2fps_bw · 0.17mm/px · 1 of 1 slices shown (2 of 14)]
[im 1/1]
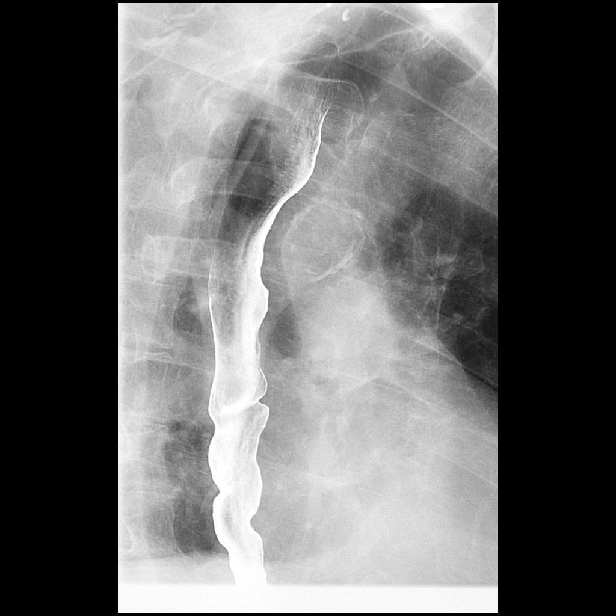

[Series 4: fluoro_barium 2fps_bw · 0.17mm/px · 1 of 1 slices shown (3 of 14)]
[im 1/1]
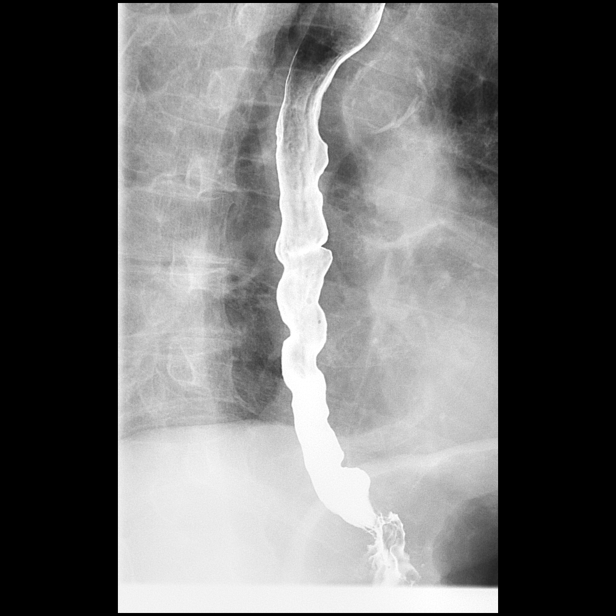

[Series 6: fluoro_barium 2fps_bw · 0.17mm/px · 1 of 1 slices shown (4 of 14)]
[im 1/1]
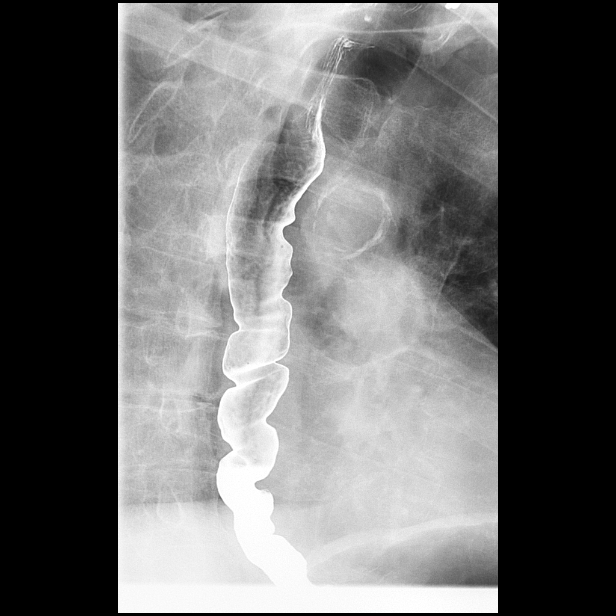

[Series 7: fluoro_barium 2fps_bw · 0.18mm/px · 1 of 1 slices shown (5 of 14)]
[im 1/1]
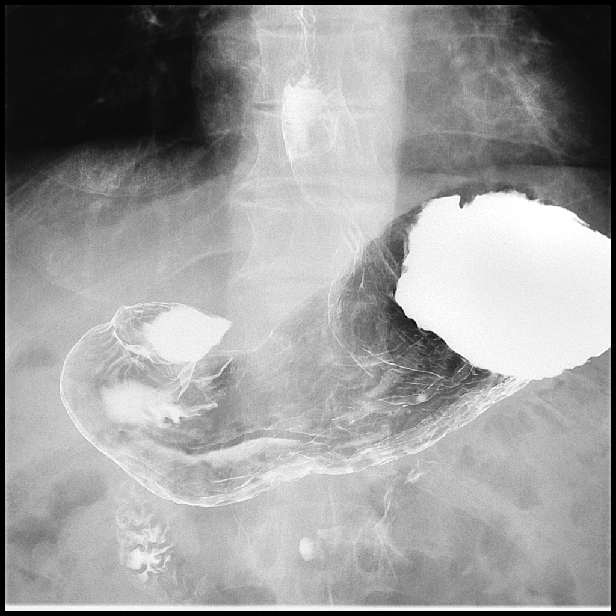

[Series 9: fluoro_barium 2fps_bw · 0.19mm/px · 1 of 1 slices shown (6 of 14)]
[im 1/1]
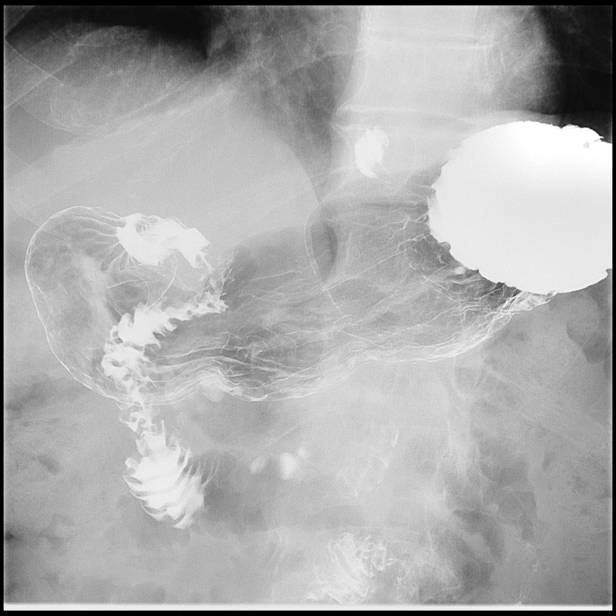

[Series 10: fluoro_barium 2fps_bw · 0.19mm/px · 1 of 1 slices shown (7 of 14)]
[im 1/1]
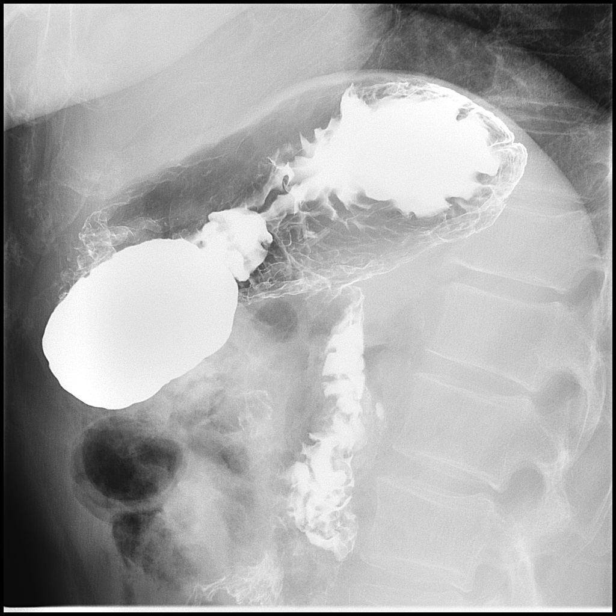

[Series 12: fluoro_barium 2fps_bw · 0.19mm/px · 1 of 1 slices shown (8 of 14)]
[im 1/1]
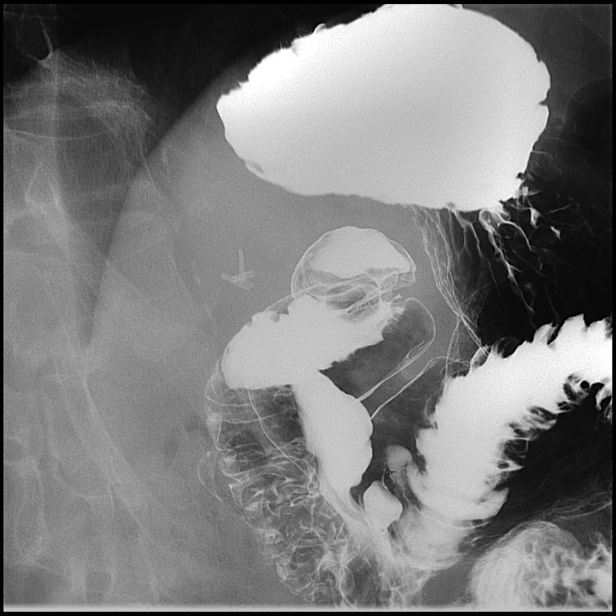

[Series 13: fluoro_barium 2fps_bw · 0.17mm/px · 1 of 1 slices shown (9 of 14)]
[im 1/1]
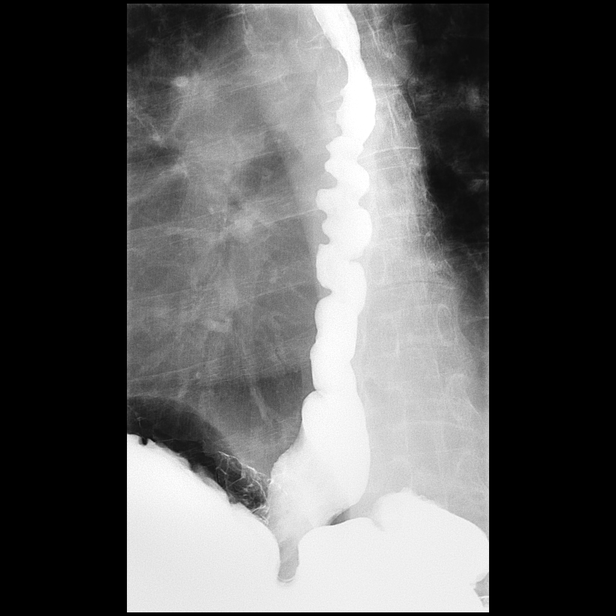

[Series 15: fluoro_barium 2fps_bw · 0.17mm/px · 1 of 1 slices shown (10 of 14)]
[im 1/1]
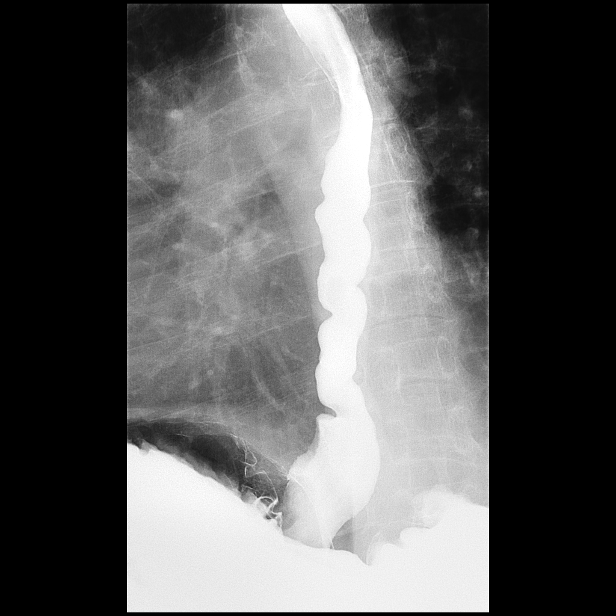

[Series 16: fluoro_barium 2fps_bw · 0.17mm/px · 1 of 1 slices shown (11 of 14)]
[im 1/1]
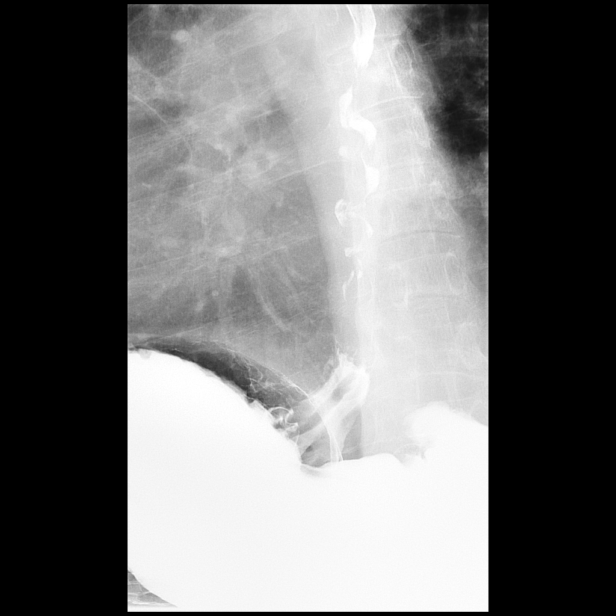

[Series 18: fluoro_barium 2fps_bw · 0.18mm/px · 1 of 1 slices shown (12 of 14)]
[im 1/1]
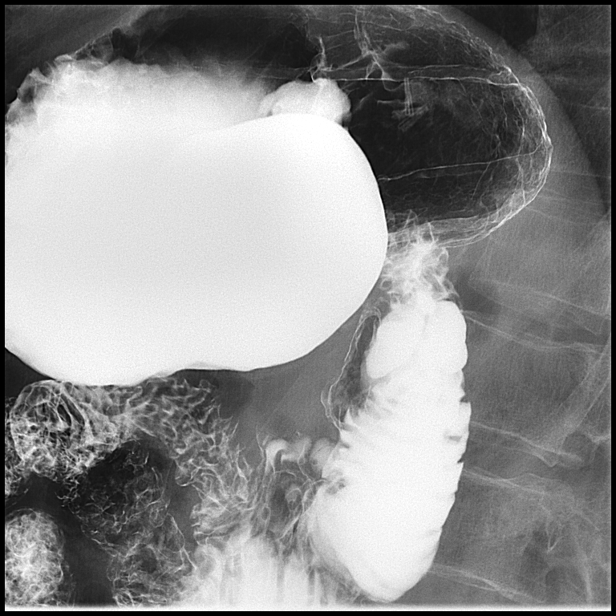

[Series 19: fluoro_barium 2fps_bw · 0.18mm/px · 1 of 1 slices shown (13 of 14)]
[im 1/1]
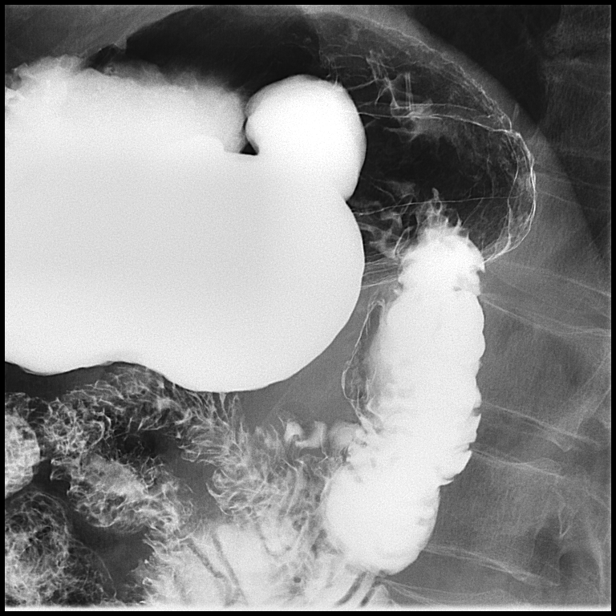

[Series 21: fluoro_barium 2fps_bw · 0.18mm/px · 1 of 1 slices shown (14 of 14)]
[im 1/1]
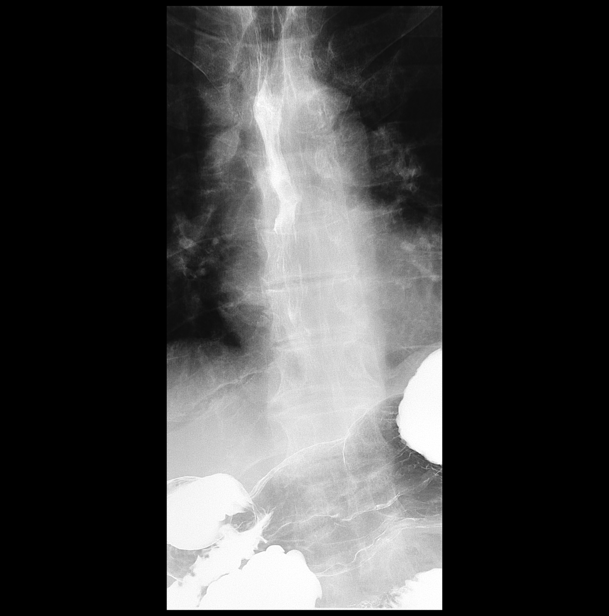

[14 of 21 positions shown; findings below may reference images not displayed]

FINDINGS: Examination demonstrates moderate soft GI dysmotility with diffuse
esophageal spasm with stasis of the contrast column within the
esophagus and multiple to and fro movements above the aortic arch.
No evidence of esophageal mass or focal filling defect. No
aspiration. No evidence of significant hiatal hernia. Small amount
of gastroesophageal reflux was noted within the distal esophagus.

Stomach is normal in size, shape and position with normal gastric
emptying. No focal stomach mass or filling defect. Duodenal bulb is
normal. More delayed images demonstrate multiple duodenal
diverticula with a large 4.5 cm diverticula over the concave portion
of the third portion of the duodenum.
IMPRESSION: 1. Significant esophageal dysmotility with diffuse esophageal spasm.
Minimal gastroesophageal reflux.

2. Multiple duodenal diverticula with large 4.5 cm diverticula along
the concavity of the third portion of the duodenum.

3.  Normal stomach.

## 2020-09-08 ENCOUNTER — Other Ambulatory Visit: Payer: Self-pay | Admitting: Urology

## 2020-09-08 DIAGNOSIS — N138 Other obstructive and reflux uropathy: Secondary | ICD-10-CM

## 2020-10-22 ENCOUNTER — Ambulatory Visit: Payer: Medicare Other | Admitting: Urology

## 2020-10-26 ENCOUNTER — Other Ambulatory Visit: Payer: Medicare Other

## 2020-10-26 ENCOUNTER — Other Ambulatory Visit: Payer: Self-pay

## 2020-10-26 DIAGNOSIS — N138 Other obstructive and reflux uropathy: Secondary | ICD-10-CM

## 2020-10-26 DIAGNOSIS — N401 Enlarged prostate with lower urinary tract symptoms: Secondary | ICD-10-CM

## 2020-10-27 LAB — PSA: Prostate Specific Ag, Serum: 1.9 ng/mL (ref 0.0–4.0)

## 2020-10-29 ENCOUNTER — Ambulatory Visit: Payer: Medicare Other | Admitting: Urology

## 2020-11-17 ENCOUNTER — Other Ambulatory Visit: Payer: Self-pay

## 2020-11-17 ENCOUNTER — Encounter: Payer: Self-pay | Admitting: Urology

## 2020-11-17 ENCOUNTER — Ambulatory Visit (INDEPENDENT_AMBULATORY_CARE_PROVIDER_SITE_OTHER): Payer: Medicare Other | Admitting: Urology

## 2020-11-17 VITALS — BP 194/79 | HR 65 | Ht 66.0 in | Wt 148.0 lb

## 2020-11-17 DIAGNOSIS — N138 Other obstructive and reflux uropathy: Secondary | ICD-10-CM

## 2020-11-17 DIAGNOSIS — R350 Frequency of micturition: Secondary | ICD-10-CM

## 2020-11-17 DIAGNOSIS — N401 Enlarged prostate with lower urinary tract symptoms: Secondary | ICD-10-CM | POA: Diagnosis not present

## 2020-11-17 DIAGNOSIS — Z87898 Personal history of other specified conditions: Secondary | ICD-10-CM | POA: Diagnosis not present

## 2020-11-17 DIAGNOSIS — R3915 Urgency of urination: Secondary | ICD-10-CM

## 2020-11-17 LAB — URINALYSIS, COMPLETE
Bilirubin, UA: NEGATIVE
Glucose, UA: NEGATIVE
Ketones, UA: NEGATIVE
Nitrite, UA: NEGATIVE
Protein,UA: NEGATIVE
RBC, UA: NEGATIVE
Specific Gravity, UA: 1.01 (ref 1.005–1.030)
Urobilinogen, Ur: 0.2 mg/dL (ref 0.2–1.0)
pH, UA: 6 (ref 5.0–7.5)

## 2020-11-17 LAB — MICROSCOPIC EXAMINATION: Bacteria, UA: NONE SEEN

## 2020-11-17 LAB — BLADDER SCAN AMB NON-IMAGING: Scan Result: 23

## 2020-11-17 MED ORDER — TAMSULOSIN HCL 0.4 MG PO CAPS: 0.4000 mg | ORAL_CAPSULE | Freq: Every day | ORAL | 2 refills | Status: DC

## 2020-11-17 MED ORDER — DUTASTERIDE 0.5 MG PO CAPS: 0.5000 mg | ORAL_CAPSULE | Freq: Every day | ORAL | 2 refills | Status: DC

## 2020-11-17 NOTE — Progress Notes (Signed)
11/17/2020 11:46 AM   Russell Jordan [birth year] [id number]  Referring provider: Sofie Hartigan, MD [place],  [place] [address]  Chief Complaint  Patient presents with  . Benign Prostatic Hypertrophy    Urologic history: 1.History elevated PSA -Initial biopsy 2004 for abnormal PSA velocity 3.9; benign -Biopsy 08/2005 PSA 8.3; focus high-grade PIN -Follow-up biopsy 2007;benign -Opted for repeat biopsy April 2008 PSA is 7.9;benign;prostate volume 68.4 cc  2.History gross hematuria -Evaluation 2011 and 2018 with negative upper tract imaging; cystoscopy remarkable for BPH  3.BPH with lower urinary tract symptoms -Combination therapy with tamsulosin/dutasteride   HPI: 85 y.o. male presents for annual follow-up.   Since last years visit he has noted worsening storage related symptoms of frequency, urgency and nocturia x2  Remains on tamsulosin/dutasteride  No significant obstructive voiding symptoms  He has requested continued PSA checks and PSA 1.9 on 11/08/2018  Denies dysuria, gross hematuria  Denies flank, abdominal or pelvic pain   PMH: Past Medical History:  Diagnosis Date  . Benign prostatic disease   . Benign prostatic hyperplasia with urinary obstruction 08/01/2013  . Cancer ([place])   . Carcinoma in situ of prostate 08/01/2013  . Dysrhythmia   . ED (erectile dysfunction) of organic origin 08/01/2013  . Elevated prostate specific antigen (PSA) 08/01/2013  . Erectile dysfunction   . Essential hypertension 07/09/2014  . Gastro-esophageal reflux disease without esophagitis 07/09/2014  . GERD (gastroesophageal reflux disease)   . Hemorrhoids   . Hyperlipidemia   . Incomplete emptying of bladder 09/14/2015  . Malignant neoplasm of skin of nose 08/01/2013   Overview:  IMO Problem List Replacer Jan. 2016  . Primary insomnia 07/09/2014  . Primary osteoarthritis of right knee 01/24/2017    Surgical History: Past Surgical History:   Procedure Laterality Date  . CHOLECYSTECTOMY    . ESOPHAGOGASTRODUODENOSCOPY (EGD) WITH PROPOFOL N/A 08/25/2016   Procedure: ESOPHAGOGASTRODUODENOSCOPY (EGD) WITH PROPOFOL;  Surgeon: [name], MD;  Location[facility] ENDOSCOPY;  Service: Endoscopy;  Laterality: N/A;  . SQUAMOUS CELL CARCINOMA EXCISION    . TONSILLECTOMY      Home Medications:  Allergies as of 11/17/2020   No Known Allergies     Medication List       Accurate as of November 17, 2020 11:46 AM. If you have any questions, ask your nurse or doctor.        aspirin EC 81 MG tablet Take 81 mg by mouth daily.   dutasteride 0.5 MG capsule Commonly known as: AVODART TAKE ONE CAPSULE BY MOUTH DAILY   losartan 50 MG tablet Commonly known as: COZAAR Take 50 mg by mouth daily.   niacin 1000 MG CR tablet Commonly known as: NIASPAN Take 1,000 mg by mouth at bedtime.   omeprazole 40 MG capsule Commonly known as: PRILOSEC Take 40 mg by mouth daily.   pantoprazole 40 MG tablet Commonly known as: PROTONIX   SENNA LAXATIVE PO Take by mouth.   sildenafil 20 MG tablet Commonly known as: REVATIO 2-5 tabs 1 hour prior to intercourse   SSD 1 % cream Generic drug: silver sulfADIAZINE APPLY CREAM EXTERNALLY TWICE DAILY UNTIL HEALED   tamsulosin 0.4 MG Caps capsule Commonly known as: FLOMAX Take 1 capsule (0.4 mg total) by mouth daily.   TRAZODONE HCL PO Take 25 mg by mouth.   valACYclovir 1000 MG tablet Commonly known as: VALTREX Take 1,000 mg by mouth 3 (three) times daily.       Allergies: No Known Allergies  Family History: History reviewed. No pertinent family history.  Social History:  reports that he has quit smoking. He has never used smokeless tobacco. He reports that he does not drink alcohol and does not use drugs.   Physical Exam: BP (!) 194/79   Pulse 65   Ht 5\' 6"  (1.676 m)   Wt 148 lb (67.1 kg)   BMI 23.89 kg/m   Constitutional:  Alert and oriented, No acute distress. HEENT: [place]  AT, moist mucus membranes.  Trachea midline, no masses. Cardiovascular: No clubbing, cyanosis, or edema. Respiratory: Normal respiratory effort, no increased work of breathing. GU: Prostate 60+ cc, smooth without nodules Psychiatric: Normal mood and affect.   Assessment & Plan:    1. BPH with obstruction/lower urinary tract symptoms  Urinalysis unremarkable today  Bladder scan PVR 23 mL  Tamsulosin/dutasteride were refilled  2.  Urinary frequency/urgency  Trial Myrbetriq 25 mg daily  Samples given and he will call back regarding efficacy  3.  Nocturia  May improve on Myrbetriq however we discussed multiple potential causes of nocturia.  He had a negative sleep study recently  Continue annual follow-up   Abbie Sons, MD  [place] [address], [place], [place] [address] [phone]

## 2020-11-30 ENCOUNTER — Encounter: Payer: Self-pay | Admitting: Ophthalmology

## 2020-11-30 ENCOUNTER — Other Ambulatory Visit: Payer: Self-pay

## 2020-12-02 ENCOUNTER — Other Ambulatory Visit
Admission: RE | Admit: 2020-12-02 | Discharge: 2020-12-02 | Disposition: A | Discharge status: Home / Self Care | Payer: Medicare Other | Source: Ambulatory Visit | Attending: Ophthalmology | Admitting: Ophthalmology

## 2020-12-02 ENCOUNTER — Other Ambulatory Visit: Payer: Self-pay

## 2020-12-02 DIAGNOSIS — Z01812 Encounter for preprocedural laboratory examination: Secondary | ICD-10-CM | POA: Diagnosis present

## 2020-12-02 DIAGNOSIS — Z20822 Contact with and (suspected) exposure to covid-19: Secondary | ICD-10-CM | POA: Insufficient documentation

## 2020-12-02 LAB — SARS CORONAVIRUS 2 (TAT 6-24 HRS): SARS Coronavirus 2: NEGATIVE

## 2020-12-02 NOTE — Discharge Instructions (Signed)

## 2020-12-06 ENCOUNTER — Encounter
Admission: RE | Disposition: A | Discharge status: Home / Self Care | Payer: Self-pay | Source: Home / Self Care | Attending: Ophthalmology

## 2020-12-06 ENCOUNTER — Ambulatory Visit: Payer: Medicare Other | Admitting: Anesthesiology

## 2020-12-06 ENCOUNTER — Other Ambulatory Visit: Payer: Self-pay

## 2020-12-06 ENCOUNTER — Ambulatory Visit
Admission: RE | Admit: 2020-12-06 | Discharge: 2020-12-06 | Disposition: A | Discharge status: Home / Self Care | Payer: Medicare Other | Attending: Ophthalmology | Admitting: Ophthalmology

## 2020-12-06 ENCOUNTER — Encounter: Payer: Self-pay | Admitting: Ophthalmology

## 2020-12-06 DIAGNOSIS — Z85820 Personal history of malignant melanoma of skin: Secondary | ICD-10-CM | POA: Insufficient documentation

## 2020-12-06 DIAGNOSIS — Z7982 Long term (current) use of aspirin: Secondary | ICD-10-CM | POA: Insufficient documentation

## 2020-12-06 DIAGNOSIS — Z79899 Other long term (current) drug therapy: Secondary | ICD-10-CM | POA: Insufficient documentation

## 2020-12-06 DIAGNOSIS — H2511 Age-related nuclear cataract, right eye: Secondary | ICD-10-CM | POA: Diagnosis present

## 2020-12-06 DIAGNOSIS — Z9049 Acquired absence of other specified parts of digestive tract: Secondary | ICD-10-CM | POA: Diagnosis not present

## 2020-12-06 DIAGNOSIS — Z8619 Personal history of other infectious and parasitic diseases: Secondary | ICD-10-CM | POA: Insufficient documentation

## 2020-12-06 DIAGNOSIS — I10 Essential (primary) hypertension: Secondary | ICD-10-CM | POA: Insufficient documentation

## 2020-12-06 DIAGNOSIS — Z87891 Personal history of nicotine dependence: Secondary | ICD-10-CM | POA: Insufficient documentation

## 2020-12-06 HISTORY — PX: CATARACT EXTRACTION W/PHACO: SHX586

## 2020-12-06 SURGERY — PHACOEMULSIFICATION, CATARACT, WITH IOL INSERTION
Anesthesia: Monitor Anesthesia Care | Site: Eye | Laterality: Right

## 2020-12-06 MED ORDER — CEFUROXIME OPHTHALMIC INJECTION 1 MG/0.1 ML
INJECTION | OPHTHALMIC | Status: DC | PRN
  Administered 2020-12-06: 0.1 mL via INTRACAMERAL

## 2020-12-06 MED ORDER — BRIMONIDINE TARTRATE-TIMOLOL 0.2-0.5 % OP SOLN
OPHTHALMIC | Status: DC | PRN
  Administered 2020-12-06: 1 [drp] via OPHTHALMIC

## 2020-12-06 MED ORDER — NA HYALUR & NA CHOND-NA HYALUR 0.4-0.35 ML IO KIT
PACK | INTRAOCULAR | Status: DC | PRN
  Administered 2020-12-06: 1 mL via INTRAOCULAR

## 2020-12-06 MED ORDER — TETRACAINE HCL 0.5 % OP SOLN
1.0000 [drp] | OPHTHALMIC | Status: DC | PRN
  Administered 2020-12-06 (×3): 1 [drp] via OPHTHALMIC

## 2020-12-06 MED ORDER — ARMC OPHTHALMIC DILATING DROPS
1.0000 "application " | OPHTHALMIC | Status: DC | PRN
  Administered 2020-12-06 (×3): 1 via OPHTHALMIC

## 2020-12-06 MED ORDER — LIDOCAINE HCL (PF) 2 % IJ SOLN
INTRAOCULAR | Status: DC | PRN
  Administered 2020-12-06: 1 mL

## 2020-12-06 MED ORDER — EPINEPHRINE PF 1 MG/ML IJ SOLN
INTRAOCULAR | Status: DC | PRN
  Administered 2020-12-06: 61 mL via OPHTHALMIC

## 2020-12-06 MED ORDER — FENTANYL CITRATE (PF) 100 MCG/2ML IJ SOLN
INTRAMUSCULAR | Status: DC | PRN
  Administered 2020-12-06: 50 ug via INTRAVENOUS

## 2020-12-06 MED ORDER — MIDAZOLAM HCL 2 MG/2ML IJ SOLN
INTRAMUSCULAR | Status: DC | PRN
  Administered 2020-12-06: .5 mg via INTRAVENOUS

## 2020-12-06 MED ORDER — LACTATED RINGERS IV SOLN: INTRAVENOUS | Status: DC

## 2020-12-06 SURGICAL SUPPLY — 22 items
CANNULA ANT/CHMB 27G (MISCELLANEOUS) ×1 IMPLANT
CANNULA ANT/CHMB 27GA (MISCELLANEOUS) ×2 IMPLANT
GLOVE SURG LX 7.5 STRW (GLOVE) ×1
GLOVE SURG LX STRL 7.5 STRW (GLOVE) ×1 IMPLANT
GLOVE SURG TRIUMPH 8.0 PF LTX (GLOVE) ×2 IMPLANT
GOWN STRL REUS W/ TWL LRG LVL3 (GOWN DISPOSABLE) ×2 IMPLANT
GOWN STRL REUS W/TWL LRG LVL3 (GOWN DISPOSABLE) ×4
LENS IOL TECNIS EYHANCE 21.5 (Intraocular Lens) ×1 IMPLANT
MARKER SKIN DUAL TIP RULER LAB (MISCELLANEOUS) ×2 IMPLANT
NDL CAPSULORHEX 25GA (NEEDLE) ×1 IMPLANT
NDL FILTER BLUNT 18X1 1/2 (NEEDLE) ×2 IMPLANT
NEEDLE CAPSULORHEX 25GA (NEEDLE) ×2 IMPLANT
NEEDLE FILTER BLUNT 18X 1/2SAF (NEEDLE) ×2
NEEDLE FILTER BLUNT 18X1 1/2 (NEEDLE) ×2 IMPLANT
PACK CATARACT BRASINGTON (MISCELLANEOUS) ×2 IMPLANT
PACK EYE AFTER SURG (MISCELLANEOUS) ×2 IMPLANT
PACK OPTHALMIC (MISCELLANEOUS) ×2 IMPLANT
SOLUTION OPHTHALMIC SALT (MISCELLANEOUS) ×2 IMPLANT
SYR 3ML LL SCALE MARK (SYRINGE) ×4 IMPLANT
SYR TB 1ML LUER SLIP (SYRINGE) ×2 IMPLANT
WATER STERILE IRR 250ML POUR (IV SOLUTION) ×2 IMPLANT
WIPE NON LINTING 3.25X3.25 (MISCELLANEOUS) ×2 IMPLANT

## 2020-12-06 NOTE — Transfer of Care (Signed)
Immediate Anesthesia Transfer of Care Note  Patient: Russell Jordan  Procedure(s) Performed: CATARACT EXTRACTION PHACO AND INTRAOCULAR LENS PLACEMENT (IOC) RIGHT 9.87 01:08.5 14.4% (Right Eye)  Patient Location: PACU  Anesthesia Type: MAC  Level of Consciousness: awake, alert  and patient cooperative  Airway and Oxygen Therapy: Patient Spontanous Breathing and Patient connected to supplemental oxygen  Post-op Assessment: Post-op Vital signs reviewed, Patient's Cardiovascular Status Stable, Respiratory Function Stable, Patent Airway and No signs of Nausea or vomiting  Post-op Vital Signs: Reviewed and stable  Complications: No complications documented.

## 2020-12-06 NOTE — Anesthesia Preprocedure Evaluation (Addendum)
Anesthesia Evaluation  Patient identified by MRN, date of birth, ID band Patient awake    Reviewed: Allergy & Precautions, NPO status , Patient's Chart, lab work & pertinent test results  Airway Mallampati: II  TM Distance: >3 FB Neck ROM: Full    Dental no notable dental hx.    Pulmonary former smoker,    Pulmonary exam normal        Cardiovascular hypertension, Normal cardiovascular exam     Neuro/Psych negative neurological ROS  negative psych ROS   GI/Hepatic Neg liver ROS, GERD  Controlled,  Endo/Other  negative endocrine ROS  Renal/GU negative Renal ROS   BPH  Prostate cancer    Musculoskeletal  (+) Arthritis , Osteoarthritis,    Abdominal Normal abdominal exam  (+)   Peds  Hematology negative hematology ROS (+)   Anesthesia Other Findings   Reproductive/Obstetrics                             Anesthesia Physical Anesthesia Plan  ASA: II  Anesthesia Plan: MAC   Post-op Pain Management:    Induction: Intravenous  PONV Risk Score and Plan: 1 and Midazolam, TIVA and Treatment may vary due to age or medical condition  Airway Management Planned: Natural Airway  Additional Equipment: None  Intra-op Plan:   Post-operative Plan:   Informed Consent: I have reviewed the patients History and Physical, chart, labs and discussed the procedure including the risks, benefits and alternatives for the proposed anesthesia with the patient or authorized representative who has indicated his/her understanding and acceptance.     Dental advisory given  Plan Discussed with: CRNA  Anesthesia Plan Comments:         Anesthesia Quick Evaluation

## 2020-12-06 NOTE — Anesthesia Postprocedure Evaluation (Signed)
Anesthesia Post Note  Patient: Russell Jordan  Procedure(s) Performed: CATARACT EXTRACTION PHACO AND INTRAOCULAR LENS PLACEMENT (IOC) RIGHT 9.87 01:08.5 14.4% (Right Eye)     Patient location during evaluation: PACU Anesthesia Type: MAC Level of consciousness: awake and alert Pain management: pain level controlled Vital Signs Assessment: post-procedure vital signs reviewed and stable Respiratory status: spontaneous breathing and nonlabored ventilation Cardiovascular status: blood pressure returned to baseline Postop Assessment: no apparent nausea or vomiting Anesthetic complications: no   No complications documented.  Marlee Armenteros [employer]

## 2020-12-06 NOTE — Anesthesia Procedure Notes (Signed)
Procedure Name: MAC Date/Time: 12/06/2020 8:13 AM Performed by: Silvana Newness, CRNA Pre-anesthesia Checklist: Patient identified, Emergency Drugs available, Suction available, Patient being monitored and Timeout performed Patient Re-evaluated:Patient Re-evaluated prior to induction Oxygen Delivery Method: Nasal cannula Placement Confirmation: positive ETCO2

## 2020-12-06 NOTE — Op Note (Signed)
LOCATION:  [facility]   PREOPERATIVE DIAGNOSIS:    Nuclear sclerotic cataract right eye. H25.11   POSTOPERATIVE DIAGNOSIS:  Nuclear sclerotic cataract right eye.     PROCEDURE:  Phacoemusification with posterior chamber intraocular lens placement of the right eye   ULTRASOUND TIME: Procedure(s): CATARACT EXTRACTION PHACO AND INTRAOCULAR LENS PLACEMENT (IOC) RIGHT 9.87 01:08.5 14.4% (Right)  LENS:   Implant Name Type Inv. Item Serial No. Manufacturer Lot No. LRB No. Used Action  LENS IOL TECNIS EYHANCE 21.5 - [id number] Intraocular Lens LENS IOL TECNIS EYHANCE 21.5 [id number] JOHNSON   Right 1 Implanted         SURGEON:  Wyonia Hough, MD   ANESTHESIA:  Topical with tetracaine drops and 2% Xylocaine jelly, augmented with 1% preservative-free intracameral lidocaine.    COMPLICATIONS:  None.   DESCRIPTION OF PROCEDURE:  The patient was identified in the holding room and transported to the operating room and placed in the supine position under the operating microscope.  The right eye was identified as the operative eye and it was prepped and draped in the usual sterile ophthalmic fashion.   A 1 millimeter clear-corneal paracentesis was made at the 12:00 position.  0.5 ml of preservative-free 1% lidocaine was injected into the anterior chamber. The anterior chamber was filled with Viscoat viscoelastic.  A 2.4 millimeter keratome was used to make a near-clear corneal incision at the 9:00 position.  A curvilinear capsulorrhexis was made with a cystotome and capsulorrhexis forceps.  Balanced salt solution was used to hydrodissect and hydrodelineate the nucleus.   Phacoemulsification was then used in stop and chop fashion to remove the lens nucleus and epinucleus.  The remaining cortex was then removed using the irrigation and aspiration handpiece. Provisc was then placed into the capsular bag to distend it for lens placement.  A lens was then injected into the capsular bag.   The remaining viscoelastic was aspirated.   Wounds were hydrated with balanced salt solution.  The anterior chamber was inflated to a physiologic pressure with balanced salt solution.  No wound leaks were noted. Cefuroxime 0.1 ml of a 10mg /ml solution was injected into the anterior chamber for a dose of 1 mg of intracameral antibiotic at the completion of the case.   Timolol and Brimonidine drops were applied to the eye.  The patient was taken to the recovery room in stable condition without complications of anesthesia or surgery.   Quintell Bonnin 12/06/2020, 8:32 AM

## 2020-12-06 NOTE — H&P (Signed)

## 2021-06-07 ENCOUNTER — Other Ambulatory Visit (INDEPENDENT_AMBULATORY_CARE_PROVIDER_SITE_OTHER): Payer: Self-pay | Admitting: Family Medicine

## 2021-06-07 DIAGNOSIS — R209 Unspecified disturbances of skin sensation: Secondary | ICD-10-CM

## 2021-06-08 ENCOUNTER — Encounter (INDEPENDENT_AMBULATORY_CARE_PROVIDER_SITE_OTHER): Payer: Self-pay | Admitting: Nurse Practitioner

## 2021-06-08 ENCOUNTER — Other Ambulatory Visit (INDEPENDENT_AMBULATORY_CARE_PROVIDER_SITE_OTHER): Payer: Self-pay | Admitting: Nurse Practitioner

## 2021-06-08 ENCOUNTER — Ambulatory Visit (INDEPENDENT_AMBULATORY_CARE_PROVIDER_SITE_OTHER): Payer: Medicare Other

## 2021-06-08 ENCOUNTER — Ambulatory Visit (INDEPENDENT_AMBULATORY_CARE_PROVIDER_SITE_OTHER): Payer: Medicare Other | Admitting: Nurse Practitioner

## 2021-06-08 ENCOUNTER — Other Ambulatory Visit: Payer: Self-pay

## 2021-06-08 VITALS — BP 144/68 | HR 59 | Ht 67.0 in | Wt 148.0 lb

## 2021-06-08 DIAGNOSIS — K219 Gastro-esophageal reflux disease without esophagitis: Secondary | ICD-10-CM

## 2021-06-08 DIAGNOSIS — R209 Unspecified disturbances of skin sensation: Secondary | ICD-10-CM

## 2021-06-08 DIAGNOSIS — I10 Essential (primary) hypertension: Secondary | ICD-10-CM | POA: Diagnosis not present

## 2021-06-08 DIAGNOSIS — Z23 Encounter for immunization: Secondary | ICD-10-CM | POA: Insufficient documentation

## 2021-06-08 NOTE — Progress Notes (Signed)
Subjective:    Patient ID: Russell Jordan, male    DOB: June 30, 1936, 85 y.o.   MRN: [id number] Chief Complaint  Patient presents with   New Patient (Initial Visit)    Np and [place] and consult BIL cold feet essential  hypertension referred by [facility]    Russell Jordan is an 85 year old male that presents today for evaluation due to bilateral cold feet.  The patient notes that his feet feel cold all the time no matter whether he is wearing socks or barefoot.  Even if the area is warm.  The patient notes that this has been going on for some time greater than 2 years.  He denies any claudication-like symptoms.  He denies any rest pain or ulceration.  He denies any numbness or tingling of his lower extremities or any known neuropathy.  Today noninvasive studies show an ABI of 1.37 on the right and 1.33 on the left.  The patient has strong triphasic tibial artery waveforms bilaterally with good toe waveforms bilaterally.  No evidence of significant lower extremity arterial disease is noted bilaterally.   Review of Systems  Cardiovascular:  Negative for leg swelling.  Skin:        Feet feel cold  Neurological:  Negative for numbness.  All other systems reviewed and are negative.     Objective:   Physical Exam Vitals reviewed.  HENT:     Head: Normocephalic.  Cardiovascular:     Rate and Rhythm: Normal rate.     Pulses: Normal pulses.  Pulmonary:     Effort: Pulmonary effort is normal.  Skin:    General: Skin is warm and dry.     Capillary Refill: Capillary refill takes less than 2 seconds.  Neurological:     Mental Status: He is alert and oriented to person, place, and time.  Psychiatric:        Mood and Affect: Mood normal.        Behavior: Behavior normal.        Thought Content: Thought content normal.        Judgment: Judgment normal.    BP (!) 144/68   Pulse (!) 59   Ht '5\' 7"'$  (1.702 m)   Wt 148 lb (67.1 kg)   BMI 23.18 kg/m   Past Medical History:  Diagnosis Date    Benign prostatic disease    Benign prostatic hyperplasia with urinary obstruction 08/01/2013   Cancer ([place])    Carcinoma in situ of prostate 08/01/2013   Dysrhythmia    ED (erectile dysfunction) of organic origin 08/01/2013   Elevated prostate specific antigen (PSA) 08/01/2013   Erectile dysfunction    Essential hypertension 07/09/2014   Gastro-esophageal reflux disease without esophagitis 07/09/2014   GERD (gastroesophageal reflux disease)    Hemorrhoids    Hyperlipidemia    Incomplete emptying of bladder 09/14/2015   Malignant neoplasm of skin of nose 08/01/2013   Overview:  IMO Problem List Replacer Jan. 2016   Primary insomnia 07/09/2014   Primary osteoarthritis of right knee 01/24/2017    Social History   Socioeconomic History   Marital status: Married    Spouse name: Not on file   Number of children: Not on file   Years of education: Not on file   Highest education level: Not on file  Occupational History   Not on file  Tobacco Use   Smoking status: Former    Types: Cigarettes    Quit date: 1967  Years since quitting: 55.7   Smokeless tobacco: Never  Vaping Use   Vaping Use: Never used  Substance and Sexual Activity   Alcohol use: No    Comment: rare   Drug use: No   Sexual activity: Yes    Birth control/protection: None  Other Topics Concern   Not on file  Social History Narrative   Not on file   Social Determinants of Health   Financial Resource Strain: Not on file  Food Insecurity: Not on file  Transportation Needs: Not on file  Physical Activity: Not on file  Stress: Not on file  Social Connections: Not on file  Intimate Partner Violence: Not on file    Past Surgical History:  Procedure Laterality Date   CATARACT EXTRACTION W/PHACO Right 12/06/2020   Procedure: CATARACT EXTRACTION PHACO AND INTRAOCULAR LENS PLACEMENT (IOC) RIGHT 9.87 01:08.5 14.4%;  Surgeon: Leandrew Koyanagi, MD;  Location: [facility];  Service: Ophthalmology;   Laterality: Right;   CHOLECYSTECTOMY     ESOPHAGOGASTRODUODENOSCOPY (EGD) WITH PROPOFOL N/A 08/25/2016   Procedure: ESOPHAGOGASTRODUODENOSCOPY (EGD) WITH PROPOFOL;  Surgeon: [name], MD;  Location: [facility] ENDOSCOPY;  Service: Endoscopy;  Laterality: N/A;   SQUAMOUS CELL CARCINOMA EXCISION     TONSILLECTOMY      History reviewed. No pertinent family history.  No Known Allergies  CBC Latest Ref Rng & Units 12/23/2018  WBC 4.0 - 10.5 K/uL 6.4  Hemoglobin 13.0 - 17.0 g/dL 13.6  Hematocrit 39.0 - 52.0 % 40.7  Platelets 150 - 400 K/uL 195      CMP  No results found for: NA, K, CL, CO2, GLUCOSE, BUN, CREATININE, CALCIUM, PROT, ALBUMIN, AST, ALT, ALKPHOS, BILITOT, GFRNONAA, GFRAA   No results found.     Assessment & Plan:   1. Bilateral cold feet Based on the noninvasive studies today the patient has no evidence of peripheral arterial disease.  The likely cold sensation in his feet is likely due to neurological changes.  Patient is advised to continue to follow-up with PCP for further work-up and evaluation of possible neurological changes of his lower extremities.  2. Essential hypertension Continue antihypertensive medications as already ordered, these medications have been reviewed and there are no changes at this time.   3. Gastro-esophageal reflux disease without esophagitis Continue PPI as already ordered, this medication has been reviewed and there are no changes at this time.  Avoidence of caffeine and alcohol  Moderate elevation of the head of the bed     Current Outpatient Medications on File Prior to Visit  Medication Sig Dispense Refill   aspirin 81 MG EC tablet Take by mouth.     aspirin EC 81 MG tablet Take 81 mg by mouth daily.     dutasteride (AVODART) 0.5 MG capsule Take 1 capsule (0.5 mg total) by mouth daily. 90 capsule 2   losartan (COZAAR) 50 MG tablet Take 50 mg by mouth daily.     losartan (COZAAR) 50 MG tablet Take 1 tablet by mouth daily.      omeprazole (PRILOSEC) 20 MG capsule Take by mouth.     omeprazole (PRILOSEC) 40 MG capsule Take 40 mg by mouth daily.     polyethylene glycol (MIRALAX / GLYCOLAX) 17 g packet Take 17 g by mouth daily as needed.     SSD 1 % cream      tamsulosin (FLOMAX) 0.4 MG CAPS capsule Take 1 capsule (0.4 mg total) by mouth daily. 90 capsule 2   TRAZODONE HCL PO  Take 25 mg by mouth.      No current facility-administered medications on file prior to visit.    There are no Patient Instructions on file for this visit. No follow-ups on file.   Kris Hartmann, NP

## 2021-06-11 ENCOUNTER — Telehealth: Payer: Self-pay | Admitting: Urology

## 2021-06-11 DIAGNOSIS — N138 Other obstructive and reflux uropathy: Secondary | ICD-10-CM

## 2021-06-14 NOTE — Telephone Encounter (Signed)
Patient called in again in regards to a refill for Tamsulosin. He said he needs 2 90-day supply for this particular medication.

## 2021-06-14 NOTE — Telephone Encounter (Signed)
Medication was sent this morning

## 2021-11-09 ENCOUNTER — Other Ambulatory Visit: Payer: Self-pay

## 2021-11-09 DIAGNOSIS — N138 Other obstructive and reflux uropathy: Secondary | ICD-10-CM

## 2021-11-09 DIAGNOSIS — N401 Enlarged prostate with lower urinary tract symptoms: Secondary | ICD-10-CM

## 2021-11-09 DIAGNOSIS — Z87898 Personal history of other specified conditions: Secondary | ICD-10-CM

## 2021-11-11 ENCOUNTER — Other Ambulatory Visit: Payer: Self-pay

## 2021-11-11 ENCOUNTER — Other Ambulatory Visit: Payer: Medicare Other

## 2021-11-11 DIAGNOSIS — Z87898 Personal history of other specified conditions: Secondary | ICD-10-CM

## 2021-11-11 DIAGNOSIS — N401 Enlarged prostate with lower urinary tract symptoms: Secondary | ICD-10-CM

## 2021-11-11 DIAGNOSIS — N138 Other obstructive and reflux uropathy: Secondary | ICD-10-CM

## 2021-11-12 LAB — PSA: Prostate Specific Ag, Serum: 1.7 ng/mL (ref 0.0–4.0)

## 2021-11-17 ENCOUNTER — Other Ambulatory Visit: Payer: Self-pay

## 2021-11-17 ENCOUNTER — Ambulatory Visit (INDEPENDENT_AMBULATORY_CARE_PROVIDER_SITE_OTHER): Payer: Medicare Other | Admitting: Urology

## 2021-11-17 ENCOUNTER — Encounter: Payer: Self-pay | Admitting: Urology

## 2021-11-17 VITALS — BP 137/75 | HR 53 | Ht 67.0 in | Wt 146.0 lb

## 2021-11-17 DIAGNOSIS — R350 Frequency of micturition: Secondary | ICD-10-CM

## 2021-11-17 DIAGNOSIS — Z87898 Personal history of other specified conditions: Secondary | ICD-10-CM | POA: Diagnosis not present

## 2021-11-17 DIAGNOSIS — R3915 Urgency of urination: Secondary | ICD-10-CM

## 2021-11-17 DIAGNOSIS — N401 Enlarged prostate with lower urinary tract symptoms: Secondary | ICD-10-CM | POA: Diagnosis not present

## 2021-11-17 DIAGNOSIS — N138 Other obstructive and reflux uropathy: Secondary | ICD-10-CM

## 2021-11-17 MED ORDER — TROSPIUM CHLORIDE 20 MG PO TABS: 20.0000 mg | ORAL_TABLET | Freq: Two times a day (BID) | ORAL | 11 refills | Status: DC

## 2021-11-17 MED ORDER — DUTASTERIDE 0.5 MG PO CAPS: 0.5000 mg | ORAL_CAPSULE | Freq: Every day | ORAL | 3 refills | Status: DC

## 2021-11-17 MED ORDER — TROSPIUM CHLORIDE 20 MG PO TABS: 20.0000 mg | ORAL_TABLET | Freq: Two times a day (BID) | ORAL | 11 refills | Status: AC

## 2021-11-17 MED ORDER — TAMSULOSIN HCL 0.4 MG PO CAPS: 0.4000 mg | ORAL_CAPSULE | Freq: Every day | ORAL | 3 refills | Status: DC

## 2021-11-17 NOTE — Progress Notes (Signed)
11/17/2021 10:35 AM   Russell Jordan [birth year] [id number]  Referring provider: Sofie Hartigan, MD [place],  [place] [address]  Chief Complaint  Patient presents with   Elevated PSA    Urologic history: 1.  History elevated PSA -Initial biopsy 2004 for abnormal PSA velocity 3.9; benign -Biopsy 08/2005 PSA 8.3; focus high-grade PIN -Follow-up biopsy 2007; benign -Opted for repeat biopsy April 2008 PSA is 7.9; benign; prostate volume 68.4 cc   2.  History gross hematuria -Evaluation 2011 and 2018 with negative upper tract imaging; cystoscopy remarkable for BPH   3.  BPH with lower urinary tract symptoms -Combination therapy with tamsulosin/dutasteride  HPI: 86 y.o. male presents for annual follow-up.  At last years visit he had noted increased frequency, urgency and nocturia and was given a trial of Myrbetriq which did help his symptoms however was not covered by insurance and was cost prohibitive He saw his primary provider at the [facility] [place] and was prescribed extended release oxybutynin 5 mg daily.  He has only been taking the medication every other day as he notes sensation of incomplete emptying if taking daily Denies dysuria, gross hematuria Denies flank, abdominal or pelvic pain PSA 11/11/2021 1.7  PMH: Past Medical History:  Diagnosis Date   Benign prostatic disease    Benign prostatic hyperplasia with urinary obstruction 08/01/2013   Cancer ([place])    Carcinoma in situ of prostate 08/01/2013   Dysrhythmia    ED (erectile dysfunction) of organic origin 08/01/2013   Elevated prostate specific antigen (PSA) 08/01/2013   Erectile dysfunction    Essential hypertension 07/09/2014   Gastro-esophageal reflux disease without esophagitis 07/09/2014   GERD (gastroesophageal reflux disease)    Hemorrhoids    Hyperlipidemia    Incomplete emptying of bladder 09/14/2015   Malignant neoplasm of skin of nose 08/01/2013   Overview:  IMO Problem List Replacer Jan. 2016    Primary insomnia 07/09/2014   Primary osteoarthritis of right knee 01/24/2017    Surgical History: Past Surgical History:  Procedure Laterality Date   CATARACT EXTRACTION W/PHACO Right 12/06/2020   Procedure: CATARACT EXTRACTION PHACO AND INTRAOCULAR LENS PLACEMENT (IOC) RIGHT 9.87 01:08.5 14.4%;  Surgeon: Leandrew Koyanagi, MD;  Location: [facility];  Service: Ophthalmology;  Laterality: Right;   CHOLECYSTECTOMY     ESOPHAGOGASTRODUODENOSCOPY (EGD) WITH PROPOFOL N/A 08/25/2016   Procedure: ESOPHAGOGASTRODUODENOSCOPY (EGD) WITH PROPOFOL;  Surgeon: [name], MD;  Location: [facility] END[facility]COPY;  Service: Endoscopy;  Laterality: N/A;   SQUAMOUS CELL CARCINOMA EXCISION     TONSILLECTOMY      Home Medications:  Allergies as of 11/17/2021   No Known Allergies      Medication List        Accurate as of November 17, 2021 10:35 AM. If you have any questions, ask your nurse or doctor.          aspirin EC 81 MG tablet Take 81 mg by mouth daily.   aspirin 81 MG EC tablet Take by mouth.   dutasteride 0.5 MG capsule Commonly known as: AVODART Take 1 capsule (0.5 mg total) by mouth daily.   losartan 50 MG tablet Commonly known as: COZAAR Take 50 mg by mouth daily.   losartan 50 MG tablet Commonly known as: COZAAR Take 1 tablet by mouth daily.   omeprazole 40 MG capsule Commonly known as: PRILOSEC Take 40 mg by mouth daily.   omeprazole 20 MG capsule Commonly known as: PRILOSEC Take by mouth.   polyethylene glycol  17 g packet Commonly known as: MIRALAX / GLYCOLAX Take 17 g by mouth daily as needed.   SSD 1 % cream Generic drug: silver sulfADIAZINE   tamsulosin 0.4 MG Caps capsule Commonly known as: FLOMAX TAKE 1 CAPSULE DAILY   TRAZODONE HCL PO Take 25 mg by mouth.        Allergies: No Known Allergies  Family History: No family history on file.  Social History:  reports that he quit smoking about 56 years ago. His smoking use included  cigarettes. He has never used smokeless tobacco. He reports that he does not drink alcohol and does not use drugs.   Physical Exam: BP 137/75    Pulse (!) 53    Ht 5\' 7"  (1.702 m)    Wt 146 lb (66.2 kg)    BMI 22.87 kg/m   Constitutional:  Alert and oriented, No acute distress. HEENT: [place] AT, moist mucus membranes.  Trachea midline, no masses. Cardiovascular: No clubbing, cyanosis, or edema. Respiratory: Normal respiratory effort, no increased work of breathing. GU: Prostate 60+ g, smooth without nodules Skin: No rashes, bruises or suspicious lesions. Psychiatric: Normal mood and affect.   Assessment & Plan:    1.  BPH with LUTS Tamsulosin and dutasteride were refilled Continue annual follow-up  2.  Urinary frequency/urgency Side effects of anticholinergic medications were reviewed including dry mouth, constipation and cognitive deficits in elderly We discussed there would be less potential for cognitive deficits with trospium and he was given Rx 20 mg twice daily.  He will discontinue the oxybutynin  3.  History elevated PSA with high-grade PIN Corrected PSA low and stable    Abbie Sons, MD  [facility] [address], [place], [place] [address] [phone]

## 2021-11-18 ENCOUNTER — Other Ambulatory Visit: Payer: Self-pay | Admitting: *Deleted

## 2021-11-18 ENCOUNTER — Telehealth: Payer: Self-pay | Admitting: *Deleted

## 2021-11-18 DIAGNOSIS — N401 Enlarged prostate with lower urinary tract symptoms: Secondary | ICD-10-CM

## 2021-11-18 MED ORDER — TAMSULOSIN HCL 0.4 MG PO CAPS: 0.4000 mg | ORAL_CAPSULE | Freq: Every day | ORAL | 3 refills | Status: DC

## 2021-11-18 NOTE — Telephone Encounter (Signed)
Received message left on Triage line regarding Tamsulosin unable to fill at [facility] due to error on their part deleting prescription. Resent to pharmacy as requested.

## 2021-11-20 ENCOUNTER — Encounter: Payer: Self-pay | Admitting: Urology

## 2022-11-17 ENCOUNTER — Encounter: Payer: Self-pay | Admitting: Urology

## 2022-11-17 ENCOUNTER — Ambulatory Visit (INDEPENDENT_AMBULATORY_CARE_PROVIDER_SITE_OTHER): Payer: Medicare Other | Admitting: Urology

## 2022-11-17 VITALS — BP 151/70 | HR 67 | Ht 67.0 in | Wt 140.0 lb

## 2022-11-17 DIAGNOSIS — N138 Other obstructive and reflux uropathy: Secondary | ICD-10-CM

## 2022-11-17 DIAGNOSIS — R3915 Urgency of urination: Secondary | ICD-10-CM | POA: Diagnosis not present

## 2022-11-17 DIAGNOSIS — N401 Enlarged prostate with lower urinary tract symptoms: Secondary | ICD-10-CM | POA: Diagnosis not present

## 2022-11-17 LAB — BLADDER SCAN AMB NON-IMAGING: Scan Result: 0

## 2022-11-17 NOTE — Progress Notes (Signed)
11/17/2022 12:50 PM   Russell Jordan 1936-10-04 [id number]  Referring provider: Sofie Hartigan, MD [place],  [place] [address]  Chief Complaint  Patient presents with   Benign Prostatic Hypertrophy    Urologic history: 1.  History elevated PSA -Initial biopsy 2004 for abnormal PSA velocity 3.9; benign -Biopsy 08/2005 PSA 8.3; focus high-grade PIN -Follow-up biopsy 2007; benign -Opted for repeat biopsy April 2008 PSA is 7.9; benign; prostate volume 68.4 cc   2.  History gross hematuria -Evaluation 2011 and 2018 with negative upper tract imaging; cystoscopy remarkable for BPH   3.  BPH with lower urinary tract symptoms -Combination therapy with tamsulosin/dutasteride  HPI: 87 y.o. male presents for annual follow-up.  Doing well since last visit Stable LUTS.  He was unable to afford Myrbetriq and states immediate release trospium was effective though he had GI side effects.  He has subsequently been able to get Myrbetriq through the [place] and he is taking every other day Denies dysuria, gross hematuria Denies flank, abdominal or pelvic pain Remains on dutasteride and tamsulosin  PMH: Past Medical History:  Diagnosis Date   Benign prostatic disease    Benign prostatic hyperplasia with urinary obstruction 08/01/2013   Cancer ([place])    Carcinoma in situ of prostate 08/01/2013   Dysrhythmia    ED (erectile dysfunction) of organic origin 08/01/2013   Elevated prostate specific antigen (PSA) 08/01/2013   Erectile dysfunction    Essential hypertension 07/09/2014   Gastro-esophageal reflux disease without esophagitis 07/09/2014   GERD (gastroesophageal reflux disease)    Hemorrhoids    Hyperlipidemia    Incomplete emptying of bladder 09/14/2015   Malignant neoplasm of skin of nose 08/01/2013   Overview:  IMO Problem List Replacer Jan. 2016   Primary insomnia 07/09/2014   Primary osteoarthritis of right knee 01/24/2017    Surgical History: Past Surgical  History:  Procedure Laterality Date   CATARACT EXTRACTION W/PHACO Right 12/06/2020   Procedure: CATARACT EXTRACTION PHACO AND INTRAOCULAR LENS PLACEMENT (IOC) RIGHT 9.87 01:08.5 14.4%;  Surgeon: Leandrew Koyanagi, MD;  Location: [facility];  Service: Ophthalmology;  Laterality: Right;   CHOLECYSTECTOMY     ESOPHAGOGASTRODUODENOSCOPY (EGD) WITH PROPOFOL N/A 08/25/2016   Procedure: ESOPHAGOGASTRODUODENOSCOPY (EGD) WITH PROPOFOL;  Surgeon: [name], MD;  Location: [facility] ENDOSCOPY;  Service: Endoscopy;  Laterality: N/A;   SQUAMOUS CELL CARCINOMA EXCISION     TONSILLECTOMY      Home Medications:  Allergies as of 11/17/2022   No Known Allergies      Medication List        Accurate as of November 17, 2022 12:50 PM. If you have any questions, ask your nurse or doctor.          aspirin EC 81 MG tablet Take 81 mg by mouth daily.   aspirin EC 81 MG tablet Take by mouth.   dutasteride 0.5 MG capsule Commonly known as: AVODART Take 1 capsule (0.5 mg total) by mouth daily.   losartan 50 MG tablet Commonly known as: COZAAR Take 50 mg by mouth daily.   losartan 50 MG tablet Commonly known as: COZAAR Take 1 tablet by mouth daily.   omeprazole 40 MG capsule Commonly known as: PRILOSEC Take 40 mg by mouth daily.   omeprazole 20 MG capsule Commonly known as: PRILOSEC Take by mouth.   polyethylene glycol 17 g packet Commonly known as: MIRALAX / GLYCOLAX Take 17 g by mouth daily as needed.   SSD 1 % cream  Generic drug: silver sulfADIAZINE   tamsulosin 0.4 MG Caps capsule Commonly known as: FLOMAX Take 1 capsule (0.4 mg total) by mouth daily.   TRAZODONE HCL PO Take 25 mg by mouth.   trospium 20 MG tablet Commonly known as: SANCTURA Take 1 tablet (20 mg total) by mouth 2 (two) times daily.        Allergies: No Known Allergies  Family History: No family history on file.  Social History:  reports that he quit smoking about 57 years ago. His  smoking use included cigarettes. He has never used smokeless tobacco. He reports that he does not drink alcohol and does not use drugs.   Physical Exam: BP (!) 151/70   Pulse 67   Ht 5' 7"$  (1.702 m)   Wt 140 lb (63.5 kg)   BMI 21.93 kg/m   Constitutional:  Alert and oriented, No acute distress. HEENT: [place] AT, moist mucus membranes.  Trachea midline, no masses. Cardiovascular: No clubbing, cyanosis, or edema. Respiratory: Normal respiratory effort, no increased work of breathing. Skin: No rashes, bruises or suspicious lesions. Psychiatric: Normal mood and affect.   Assessment & Plan:    1.  BPH with LUTS Stable on combination therapy.  He did not need refills of dutasteride or tamsulosin Continue annual follow-up  2.  Urinary frequency/urgency Getting Myrbetriq through the [facility] with good efficacy    Abbie Sons, MD  [facility] [address] [place], [place] [address] [phone]

## 2022-11-18 LAB — URINALYSIS, COMPLETE
Bilirubin, UA: NEGATIVE
Glucose, UA: NEGATIVE
Ketones, UA: NEGATIVE
Nitrite, UA: NEGATIVE
Protein,UA: NEGATIVE
RBC, UA: NEGATIVE
Specific Gravity, UA: 1.01 (ref 1.005–1.030)
Urobilinogen, Ur: 0.2 mg/dL (ref 0.2–1.0)
pH, UA: 6 (ref 5.0–7.5)

## 2022-11-18 LAB — MICROSCOPIC EXAMINATION: Bacteria, UA: NONE SEEN

## 2023-01-17 ENCOUNTER — Other Ambulatory Visit: Payer: Self-pay | Admitting: Family Medicine

## 2023-01-17 DIAGNOSIS — N138 Other obstructive and reflux uropathy: Secondary | ICD-10-CM

## 2023-01-17 MED ORDER — TAMSULOSIN HCL 0.4 MG PO CAPS: 0.4000 mg | ORAL_CAPSULE | Freq: Every day | ORAL | 3 refills | Status: DC

## 2023-06-07 ENCOUNTER — Telehealth: Payer: Self-pay | Admitting: Urology

## 2023-06-07 ENCOUNTER — Other Ambulatory Visit: Payer: Self-pay | Admitting: *Deleted

## 2023-06-07 DIAGNOSIS — N138 Other obstructive and reflux uropathy: Secondary | ICD-10-CM

## 2023-06-07 MED ORDER — DUTASTERIDE 0.5 MG PO CAPS
0.5000 mg | ORAL_CAPSULE | Freq: Every day | ORAL | 3 refills | Status: DC
Start: 2023-06-07 — End: 2023-06-07

## 2023-06-07 MED ORDER — DUTASTERIDE 0.5 MG PO CAPS
0.5000 mg | ORAL_CAPSULE | Freq: Every day | ORAL | 3 refills | Status: DC
Start: 2023-06-07 — End: 2023-10-15

## 2023-06-07 NOTE — Telephone Encounter (Signed)
Patient called to request refill for Dutasteride 0.5 mg capsule. [facility] is [facility] [place].

## 2023-06-07 NOTE — Telephone Encounter (Signed)
Left message that medication was sent in on voice mail.

## 2023-06-07 NOTE — Addendum Note (Signed)
Addended by: Levada Schilling on: 06/07/2023 04:25 PM   Modules accepted: Orders

## 2023-10-15 ENCOUNTER — Ambulatory Visit: Payer: Medicare Other | Admitting: Urology

## 2023-10-15 VITALS — BP 132/80 | HR 74 | Ht 67.0 in | Wt 144.0 lb

## 2023-10-15 DIAGNOSIS — R350 Frequency of micturition: Secondary | ICD-10-CM | POA: Diagnosis not present

## 2023-10-15 DIAGNOSIS — N401 Enlarged prostate with lower urinary tract symptoms: Secondary | ICD-10-CM

## 2023-10-15 DIAGNOSIS — Z125 Encounter for screening for malignant neoplasm of prostate: Secondary | ICD-10-CM | POA: Diagnosis not present

## 2023-10-15 DIAGNOSIS — N138 Other obstructive and reflux uropathy: Secondary | ICD-10-CM

## 2023-10-15 DIAGNOSIS — Z87898 Personal history of other specified conditions: Secondary | ICD-10-CM

## 2023-10-15 LAB — URINALYSIS, COMPLETE
Bilirubin, UA: NEGATIVE
Glucose, UA: NEGATIVE
Ketones, UA: NEGATIVE
Nitrite, UA: NEGATIVE
Protein,UA: NEGATIVE
Specific Gravity, UA: 1.015 (ref 1.005–1.030)
Urobilinogen, Ur: 0.2 mg/dL (ref 0.2–1.0)
pH, UA: 6 (ref 5.0–7.5)

## 2023-10-15 LAB — MICROSCOPIC EXAMINATION

## 2023-10-15 MED ORDER — DUTASTERIDE 0.5 MG PO CAPS: 0.5000 mg | ORAL_CAPSULE | Freq: Every day | ORAL | 3 refills | Status: DC

## 2023-10-15 NOTE — Progress Notes (Signed)
 I,Amy L Pierron,acting as a scribe for Glendia JAYSON Barba, MD.,have documented all relevant documentation on the behalf of Glendia JAYSON Barba, MD,as directed by  Glendia JAYSON Barba, MD while in the presence of Glendia JAYSON Barba, MD.  10/15/2023 8:54 PM   Russell Jordan 1936/05/10 [id number]  Referring provider: Jeffie Cheryl BRAVO, MD [address] [place],  [place] [address]  Chief Complaint  Patient presents with   Benign Prostatic Hypertrophy    Urologic history: 1.  History elevated PSA Initial biopsy 2004 for abnormal PSA velocity 3.9; benign Biopsy 08/2005 PSA 8.3; focus high-grade PIN Follow-up biopsy 2007; benign Opted for repeat biopsy April 2008 PSA is 7.9; benign; prostate volume 68.4 cc   2.  History gross hematuria Evaluation 2011 and 2018 with negative upper tract imaging; cystoscopy remarkable for BPH   3.  BPH with lower urinary tract symptoms Combination therapy with tamsulosin /dutasteride   HPI: 88 y.o. male presents for annual follow-up.  Doing well since last visit Stable LUTS.  Remains on Tamsulosin , Dutasteride , and Myrbetriq. Denies dysuria, gross hematuria Denies flank, abdominal or pelvic pain He also requested a DRE and PSA today.   PMH: Past Medical History:  Diagnosis Date   Benign prostatic disease    Benign prostatic hyperplasia with urinary obstruction 08/01/2013   Cancer ([facility])    Carcinoma in situ of prostate 08/01/2013   Dysrhythmia    ED (erectile dysfunction) of organic origin 08/01/2013   Elevated prostate specific antigen (PSA) 08/01/2013   Erectile dysfunction    Essential hypertension 07/09/2014   Gastro-esophageal reflux disease without esophagitis 07/09/2014   GERD (gastroesophageal reflux disease)    Hemorrhoids    Hyperlipidemia    Incomplete emptying of bladder 09/14/2015   Malignant neoplasm of skin of nose 08/01/2013   Overview:  IMO Problem List Replacer Jan. 2016   Primary insomnia 07/09/2014   Primary osteoarthritis of right knee  01/24/2017    Surgical History: Past Surgical History:  Procedure Laterality Date   CATARACT EXTRACTION W/PHACO Right 12/06/2020   Procedure: CATARACT EXTRACTION PHACO AND INTRAOCULAR LENS PLACEMENT (IOC) RIGHT 9.87 01:08.5 14.4%;  Surgeon: Mittie Gaskin, MD;  Location: [facility];  Service: Ophthalmology;  Laterality: Right;   CHOLECYSTECTOMY     ESOPHAGOGASTRODUODENOSCOPY (EGD) WITH PROPOFOL  N/A 08/25/2016   Procedure: ESOPHAGOGASTRODUODENOSCOPY (EGD) WITH PROPOFOL ;  Surgeon: Gladis RAYMOND Mariner, MD;  Location: [facility] ENDOSCOPY;  Service: Endoscopy;  Laterality: N/A;   SQUAMOUS CELL CARCINOMA EXCISION     TONSILLECTOMY      Home Medications:  Allergies as of 10/15/2023   No Known Allergies      Medication List        Accurate as of October 15, 2023  8:54 PM. If you have any questions, ask your nurse or doctor.          aspirin EC 81 MG tablet Take 81 mg by mouth daily.   aspirin EC 81 MG tablet Take by mouth.   dutasteride  0.5 MG capsule Commonly known as: AVODART  Take 1 capsule (0.5 mg total) by mouth daily.   losartan 50 MG tablet Commonly known as: COZAAR Take 50 mg by mouth daily.   losartan 50 MG tablet Commonly known as: COZAAR Take 1 tablet by mouth daily.   omeprazole 40 MG capsule Commonly known as: PRILOSEC Take 40 mg by mouth daily.   omeprazole 20 MG capsule Commonly known as: PRILOSEC Take by mouth.   polyethylene glycol 17 g packet Commonly known as: MIRALAX / GLYCOLAX Take  17 g by mouth daily as needed.   SSD 1 % cream Generic drug: silver sulfADIAZINE   tamsulosin  0.4 MG Caps capsule Commonly known as: FLOMAX  Take 1 capsule (0.4 mg total) by mouth daily.   TRAZODONE HCL PO Take 25 mg by mouth.   trospium  20 MG tablet Commonly known as: SANCTURA  Take 1 tablet (20 mg total) by mouth 2 (two) times daily.        Allergies: No Known Allergies  Family History: No family history on file.  Social History:  reports  that he quit smoking about 58 years ago. His smoking use included cigarettes. He has never used smokeless tobacco. He reports that he does not drink alcohol and does not use drugs.   Physical Exam: BP 132/80   Pulse 74   Ht 5' 7 (1.702 m)   Wt 144 lb (65.3 kg)   BMI 22.55 kg/m   Constitutional:  Alert and oriented, No acute distress. HEENT: [place] AT Respiratory: Normal respiratory effort, no increased work of breathing. GU: Prostate 50 grams, smooth without nodules or induration. There is a superficial calcification, most likely a phlebolith in the rectal wall. Psychiatric: Normal mood and affect.   Assessment & Plan:    1.  BPH with LUTS Stable on combination therapy.  He did not need refills of dutasteride  or tamsulosin  Continue annual follow-up  2.  Urinary frequency/urgency [facility]  3. Prostate cancer screening We discussed prostate cancer screening is no longer recommended for men in their 80's due to the low incidence of high-risk prostate cancer. His last PSA a few years ago was very low. He requested to have it drawn which was ordered. Continue annual follow-up.  I have reviewed the above documentation for accuracy and completeness, and I agree with the above.   Glendia JAYSON Barba, MD  [facility] [address], [address] [place], [place] [address] [phone]

## 2023-10-16 LAB — PSA: Prostate Specific Ag, Serum: 2.6 ng/mL (ref 0.0–4.0)

## 2023-10-19 NOTE — Progress Notes (Signed)
 Left message for patient to return the call.

## 2023-10-21 ENCOUNTER — Encounter: Payer: Self-pay | Admitting: Urology

## 2023-11-15 ENCOUNTER — Telehealth: Payer: Self-pay | Admitting: Cardiovascular Disease

## 2023-11-15 ENCOUNTER — Telehealth: Payer: Self-pay | Admitting: *Deleted

## 2023-11-15 DIAGNOSIS — I38 Endocarditis, valve unspecified: Secondary | ICD-10-CM | POA: Insufficient documentation

## 2023-11-15 NOTE — Telephone Encounter (Signed)
 LMOVM to verify card hx.

## 2023-11-15 NOTE — Progress Notes (Signed)
 Cardiology Office Note  Date:  11/16/2023   ID:  Russell Jordan, DOB 1935-12-08, MRN [id number]  PCP:  Jeffie Cheryl BRAVO, MD   Cc: valvular heart disease, angina   HPI:  Mr. Russell Jordan is a 88 year old gentleman with past medical history of Valvular regurgitation Hypertension Hyperlipidemia Who presents by referral from Dr. Darreld for valvular heart disease, angina  Reports he has had at least 3 episodes of general malaise, chest and abdominal discomfort when overexerting himself Recent episode was power washing the driveway Ate lunch Started power washing  sidewalk Felt sick, upset stomach Went inside,  Nurses of wife and checked his vitals which were stable Symptoms eventually resolved without intervention Concerned now to [facility] himself given recurrence of the symptoms  Prior episodes when overexerting such as mowing the lawn Typically has to rest 30 minutes to recover  Denies significant shortness of breath on exertion, no lower extremity edema no PND orthopnea  Discussed recent imaging done through primary care Echocardiogram August 13, 2023 NORMAL LEFT VENTRICULAR SYSTOLIC FUNCTION WITH NO LVH ESTIMATED EF: >55%, CALC EF(2D): 66% NORMAL LA PRESSURES WITH NORMAL DIASTOLIC FUNCTION NORMAL RIGHT VENTRICULAR SYSTOLIC FUNCTION VALVULAR REGURGITATION: MODERATE AR, MODERATE MR, MODERATE PR, MODERATE TR NO VALVULAR STENOSIS   EKG personally reviewed by myself on todays visit EKG Interpretation Date/Time:  Friday November 16 2023 09:44:18 EST Ventricular Rate:  59 PR Interval:  140 QRS Duration:  96 QT Interval:  422 QTC Calculation: 417 R Axis:   -1  Text Interpretation: Sinus bradycardia When compared with ECG of 08-Dec-2003 17:38, No significant change was found Confirmed by Perla Lye [phone]) on 11/16/2023 9:58:13 AM    PMH:   has a past medical history of Benign prostatic disease, Benign prostatic hyperplasia with urinary obstruction (08/01/2013),  Cancer ([facility]), Carcinoma in situ of prostate (08/01/2013), Dysrhythmia, ED (erectile dysfunction) of organic origin (08/01/2013), Elevated prostate specific antigen (PSA) (08/01/2013), Erectile dysfunction, Essential hypertension (07/09/2014), Gastro-esophageal reflux disease without esophagitis (07/09/2014), GERD (gastroesophageal reflux disease), Hemorrhoids, Hyperlipidemia, Incomplete emptying of bladder (09/14/2015), Malignant neoplasm of skin of nose (08/01/2013), Primary insomnia (07/09/2014), and Primary osteoarthritis of right knee (01/24/2017).  PSH:    Past Surgical History:  Procedure Laterality Date   CATARACT EXTRACTION W/PHACO Right 12/06/2020   Procedure: CATARACT EXTRACTION PHACO AND INTRAOCULAR LENS PLACEMENT (IOC) RIGHT 9.87 01:08.5 14.4%;  Surgeon: Mittie Gaskin, MD;  Location: [facility];  Service: Ophthalmology;  Laterality: Right;   CHOLECYSTECTOMY     ESOPHAGOGASTRODUODENOSCOPY (EGD) WITH PROPOFOL  N/A 08/25/2016   Procedure: ESOPHAGOGASTRODUODENOSCOPY (EGD) WITH PROPOFOL ;  Surgeon: Gladis RAYMOND Mariner, MD;  Location: [facility] ENDOSCOPY;  Service: Endoscopy;  Laterality: N/A;   SQUAMOUS CELL CARCINOMA EXCISION     TONSILLECTOMY      Current Outpatient Medications  Medication Sig Dispense Refill   aspirin EC 81 MG tablet Take 81 mg by mouth 3 (three) times a week.     atorvastatin (LIPITOR) 20 MG tablet Take 20 mg by mouth daily.     cyanocobalamin (VITAMIN B12) 1000 MCG tablet Take 1,000 mcg by mouth daily.     dutasteride  (AVODART ) 0.5 MG capsule Take 1 capsule (0.5 mg total) by mouth daily. (Patient taking differently: Take 0.5 mg by mouth 3 (three) times a week.) 90 capsule 3   losartan (COZAAR) 50 MG tablet Take 50 mg by mouth daily.     mirabegron ER (MYRBETRIQ) 25 MG TB24 tablet Take 25 mg by mouth every other day.     omeprazole (PRILOSEC) 40 MG capsule  Take 40 mg by mouth as needed.     polyethylene glycol (MIRALAX / GLYCOLAX) 17 g packet Take 17 g by mouth  daily as needed.     SSD 1 % cream      tamsulosin  (FLOMAX ) 0.4 MG CAPS capsule Take 1 capsule (0.4 mg total) by mouth daily. (Patient taking differently: Take 0.4 mg by mouth every other day.) 90 capsule 3   TRAZODONE HCL PO Take 25 mg by mouth. Takes 1/2 a tab     trospium  (SANCTURA ) 20 MG tablet Take 1 tablet (20 mg total) by mouth 2 (two) times daily. 60 tablet 11   aspirin 81 MG EC tablet Take by mouth. (Patient not taking: Reported on 11/16/2023)     losartan (COZAAR) 50 MG tablet Take 1 tablet by mouth daily. (Patient not taking: Reported on 11/16/2023)     metoprolol  tartrate (LOPRESSOR ) 25 MG tablet TAKE 1 TABLET 2 HR PRIOR TO CARDIAC PROCEDURE 1 tablet 0   omeprazole (PRILOSEC) 20 MG capsule Take by mouth.     No current facility-administered medications for this visit.     Allergies:   Patient has no known allergies.   Social History:  The patient  reports that he quit smoking about 58 years ago. His smoking use included cigarettes. He has never used smokeless tobacco. He reports that he does not drink alcohol and does not use drugs.   Family History:   family history is not on file.    Review of Systems: Review of Systems  Constitutional: Negative.   HENT: Negative.    Respiratory: Negative.    Cardiovascular:  Positive for chest pain.  Gastrointestinal:  Positive for abdominal pain.  Musculoskeletal: Negative.   Neurological: Negative.   Psychiatric/Behavioral: Negative.    All other systems reviewed and are negative.    PHYSICAL EXAM: VS:  BP 126/84   Pulse (!) 59   Ht 5' 7 (1.702 m)   Wt 150 lb 3.2 oz (68.1 kg)   SpO2 98%   BMI 23.52 kg/m  , BMI Body mass index is 23.52 kg/m. GEN: Well nourished, well developed, in no acute distress HEENT: normal Neck: no JVD, carotid bruits, or masses Cardiac: RRR; no murmurs, rubs, or gallops,no edema  Respiratory:  clear to auscultation bilaterally, normal work of breathing GI: soft, nontender, nondistended, + BS MS:  no deformity or atrophy Skin: warm and dry, no rash Neuro:  Strength and sensation are intact Psych: euthymic mood, full affect   Recent Labs: No results found for requested labs within last 365 days.    Lipid Panel No results found for: CHOL, HDL, LDLCALC, TRIG    Wt Readings from Last 3 Encounters:  11/16/23 150 lb 3.2 oz (68.1 kg)  10/15/23 144 lb (65.3 kg)  11/17/22 140 lb (63.5 kg)       ASSESSMENT AND PLAN:  Problem List Items Addressed This Visit       Cardiology Problems   Heart valve disease - Primary   Relevant Medications   atorvastatin (LIPITOR) 20 MG tablet   metoprolol  tartrate (LOPRESSOR ) 25 MG tablet   Other Relevant Orders   EKG 12-Lead (Completed)   Hyperlipidemia   Relevant Medications   atorvastatin (LIPITOR) 20 MG tablet   metoprolol  tartrate (LOPRESSOR ) 25 MG tablet   Other Relevant Orders   EKG 12-Lead (Completed)   Other Visit Diagnoses       Angina pectoris (HCC)       Relevant Medications   atorvastatin (LIPITOR)  20 MG tablet   metoprolol  tartrate (LOPRESSOR ) 25 MG tablet     Precordial pain       Relevant Orders   Basic metabolic panel   CT CORONARY MORPH W/CTA COR W/SCORE W/CA W/CM &/OR WO/CM      Angina Having symptoms when overexerting, has to sit and wait for symptoms to resolve, typically takes 30 minutes Several episodes that seem to present when he is overexerted himself Recent echocardiogram reviewed showing normal LV function, diffuse valvular heart disease, unable to evaluate images Discussed various options for workup, we have ordered cardiac CTA to rule out ischemia  Valvular heart disease Recent echocardiogram done at outside facility showing diffuse moderate valvular heart disease, every valve No significant murmurs appreciated on exam, no CHF symptoms Blood pressure stable, no further workup at this time  Hyperlipidemia Continue Lipitor 20 daily  Essential hypertension Blood pressure is well  controlled on today's visit. No changes made to the medications.   Signed, Velinda Lunger, M.D., Ph.D. [facility] [place], [place] [phone]

## 2023-11-15 NOTE — Telephone Encounter (Signed)
 Wife Marily Shows) returned staff call to report patient had not seen a cardiologist in the past.

## 2023-11-16 ENCOUNTER — Ambulatory Visit: Payer: Medicare Other | Attending: Cardiovascular Disease | Admitting: Cardiovascular Disease

## 2023-11-16 ENCOUNTER — Encounter: Payer: Self-pay | Admitting: Cardiovascular Disease

## 2023-11-16 VITALS — BP 126/84 | HR 59 | Ht 67.0 in | Wt 150.2 lb

## 2023-11-16 DIAGNOSIS — E782 Mixed hyperlipidemia: Secondary | ICD-10-CM | POA: Diagnosis present

## 2023-11-16 DIAGNOSIS — R072 Precordial pain: Secondary | ICD-10-CM | POA: Insufficient documentation

## 2023-11-16 DIAGNOSIS — I209 Angina pectoris, unspecified: Secondary | ICD-10-CM | POA: Diagnosis present

## 2023-11-16 DIAGNOSIS — I10 Essential (primary) hypertension: Secondary | ICD-10-CM | POA: Insufficient documentation

## 2023-11-16 DIAGNOSIS — I38 Endocarditis, valve unspecified: Secondary | ICD-10-CM | POA: Diagnosis not present

## 2023-11-16 MED ORDER — METOPROLOL TARTRATE 25 MG PO TABS: ORAL_TABLET | ORAL | 0 refills | Status: DC

## 2023-11-16 MED ORDER — METOPROLOL TARTRATE 25 MG PO TABS: ORAL_TABLET | ORAL | 0 refills | Status: AC

## 2023-11-16 NOTE — Patient Instructions (Addendum)
 Medication Instructions:  No changes  If you need a refill on your cardiac medications before your next appointment, please call your pharmacy.   Lab work: No new labs needed  Testing/Procedures: Cardiac CTA for angina Please see instructions below   Follow-Up: At [facility], you and your health needs are our priority.  As part of our continuing mission to provide you with exceptional heart care, we have created designated Provider Care Teams.  These Care Teams include your primary Cardiologist (physician) and Advanced Practice Providers (APPs -  Physician Assistants and Nurse Practitioners) who all work together to provide you with the care you need, when you need it.  You will need a follow up appointment as needed  Providers on your designated Care Team:   Lonni Meager, NP Bernardino Bring, PA-C Cadence [place], [place]  COVID-19 Vaccine Information can be found at: [email or web address] For questions related to vaccine distribution or appointments, please email [email or web address] or call [phone].     Your cardiac CT will be scheduled at one of the below locations:   [facility] [address] [place], [place] [address] [phone]  OR  [facility] [address] [place], [place] [address] [phone]  OR   [facility] [address] [place], [place] [address] [phone]  OR   [facility] [address] [place], [place] [address] [phone]  If scheduled at [facility], please arrive at the [facility] (Entrance C2) of [facility] 30 minutes prior to test start time. You can use the FREE valet parking offered at entrance C (encouraged to control the heart rate for the test)  Proceed to the [facility] Radiology Department (first floor) to check-in and test  prep.  All radiology patients and guests should use entrance C2 at [facility], accessed from [facility], even though the hospital's physical address listed is [address].    If scheduled at [facility] or [facility], please arrive 15 mins early for check-in and test prep.  There is spacious parking and easy access to the radiology department from the [facility] entrance. Please enter here and check-in with the desk attendant.   If scheduled at [facility], please arrive 30 minutes early for check-in and test prep.  Please follow these instructions carefully (unless otherwise directed):  An IV will be required for this test and Nitroglycerin  will be given.  Hold all erectile dysfunction medications at least 3 days (72 hrs) prior to test. (Ie viagra , cialis, sildenafil , tadalafil, etc)   On the Night Before the Test: Be sure to Drink plenty of water. Do not consume any caffeinated/decaffeinated beverages or chocolate 12 hours prior to your test. Do not take any antihistamines 12 hours prior to your test.  On the Day of the Test: Drink plenty of water until 1 hour prior to the test. Do not eat any food 1 hour prior to test. You may take your regular medications prior to the test.  Take metoprolol  (Lopressor ) 25 mg two hours prior to test. If you take Furosemide/Hydrochlorothiazide/Spironolactone/Chlorthalidone, please HOLD on the morning of the test. Patients who wear a continuous glucose monitor MUST remove the device prior to scanning. FEMALES- please wear underwire-free bra if available, avoid dresses & tight clothing       After the Test: Drink plenty of water. After receiving IV contrast,  you may experience a mild flushed feeling. This is normal. On occasion, you may experience a mild rash up to 24 hours after the test. This is not dangerous. If this occurs, you can take  Benadryl 25 mg, Zyrtec, Claritin, or Allegra and increase your fluid intake. (Patients taking Tikosyn should avoid Benadryl, and may take Zyrtec, Claritin, or Allegra) If you experience trouble breathing, this can be serious. If it is severe call 911 IMMEDIATELY. If it is mild, please call our office.  We will call to schedule your test 2-4 weeks out understanding that some insurance companies will need an authorization prior to the service being performed.   For more information and frequently asked questions, please visit our website : [email or web address]  For non-scheduling related questions, please contact the cardiac imaging nurse navigator should you have any questions/concerns: Cardiac Imaging Nurse Navigators Direct Office Dial: [phone]   For scheduling needs, including cancellations and rescheduling, please call Brittany, [phone].

## 2023-11-17 LAB — BASIC METABOLIC PANEL
BUN/Creatinine Ratio: 15 (ref 10–24)
BUN: 16 mg/dL (ref 8–27)
CO2: 25 mmol/L (ref 20–29)
Calcium: 9.7 mg/dL (ref 8.6–10.2)
Chloride: 102 mmol/L (ref 96–106)
Creatinine, Ser: 1.1 mg/dL (ref 0.76–1.27)
Glucose: 92 mg/dL (ref 70–99)
Potassium: 4.8 mmol/L (ref 3.5–5.2)
Sodium: 138 mmol/L (ref 134–144)
eGFR: 66 mL/min/{1.73_m2} (ref >59)

## 2023-11-19 ENCOUNTER — Encounter: Payer: Self-pay | Admitting: Emergency Medicine

## 2023-11-19 ENCOUNTER — Ambulatory Visit: Payer: Medicare Other | Admitting: Urology

## 2023-11-21 ENCOUNTER — Telehealth (HOSPITAL_COMMUNITY): Payer: Self-pay | Admitting: *Deleted

## 2023-11-21 NOTE — Telephone Encounter (Signed)
Reaching out to patient to offer assistance regarding upcoming cardiac imaging study; pt verbalizes understanding of appt date/time, parking situation and where to check in, pre-test NPO status and medications ordered, and verified current allergies; name and call back number provided for further questions should they arise Johney Frame RN Navigator Cardiac Imaging [facility] [phone] office [phone] cell

## 2023-11-22 ENCOUNTER — Ambulatory Visit
Admission: RE | Admit: 2023-11-22 | Discharge: 2023-11-22 | Disposition: A | Discharge status: Home / Self Care | Payer: Medicare Other | Source: Ambulatory Visit | Attending: Cardiovascular Disease | Admitting: Cardiovascular Disease

## 2023-11-22 DIAGNOSIS — I25119 Atherosclerotic heart disease of native coronary artery with unspecified angina pectoris: Secondary | ICD-10-CM | POA: Diagnosis not present

## 2023-11-22 DIAGNOSIS — I209 Angina pectoris, unspecified: Secondary | ICD-10-CM | POA: Diagnosis present

## 2023-11-22 DIAGNOSIS — R072 Precordial pain: Secondary | ICD-10-CM | POA: Diagnosis present

## 2023-11-22 MED ORDER — METOPROLOL TARTRATE 5 MG/5ML IV SOLN: 10.0000 mg | Freq: Once | INTRAVENOUS | Status: DC | PRN

## 2023-11-22 MED ORDER — NITROGLYCERIN 0.4 MG SL SUBL
0.8000 mg | SUBLINGUAL_TABLET | Freq: Once | SUBLINGUAL | Status: AC
  Administered 2023-11-22: 0.8 mg via SUBLINGUAL

## 2023-11-22 MED ORDER — IOHEXOL 350 MG/ML SOLN
75.0000 mL | Freq: Once | INTRAVENOUS | Status: AC | PRN
  Administered 2023-11-22: 75 mL via INTRAVENOUS

## 2023-11-22 MED ORDER — SODIUM CHLORIDE 0.9 % IV SOLN: INTRAVENOUS | Status: DC

## 2023-11-22 MED ORDER — DILTIAZEM HCL 25 MG/5ML IV SOLN: 10.0000 mg | INTRAVENOUS | Status: DC | PRN

## 2023-11-22 NOTE — Progress Notes (Signed)
Patient tolerated procedure well. Ambulate w/o difficulty. Denies light headedness or being dizzy. Sitting up drinking water provided. Encouraged to drink extra water today and reasoning explained. Verbalized understanding. All questions answered. ABC intact. No further needs. Discharge from procedure area w/o issues.

## 2023-11-23 ENCOUNTER — Encounter: Payer: Self-pay | Admitting: Cardiovascular Disease

## 2024-02-26 ENCOUNTER — Other Ambulatory Visit: Payer: Self-pay | Admitting: Urology

## 2024-02-26 DIAGNOSIS — N138 Other obstructive and reflux uropathy: Secondary | ICD-10-CM

## 2024-10-10 ENCOUNTER — Ambulatory Visit: Payer: Self-pay | Admitting: Urology

## 2024-10-15 ENCOUNTER — Ambulatory Visit (INDEPENDENT_AMBULATORY_CARE_PROVIDER_SITE_OTHER): Admitting: Urology

## 2024-10-15 ENCOUNTER — Encounter: Payer: Self-pay | Admitting: Urology

## 2024-10-15 VITALS — BP 148/72 | HR 77 | Ht 67.0 in | Wt 145.0 lb

## 2024-10-15 DIAGNOSIS — N401 Enlarged prostate with lower urinary tract symptoms: Secondary | ICD-10-CM | POA: Diagnosis not present

## 2024-10-15 DIAGNOSIS — R3129 Other microscopic hematuria: Secondary | ICD-10-CM

## 2024-10-15 DIAGNOSIS — N138 Other obstructive and reflux uropathy: Secondary | ICD-10-CM | POA: Diagnosis not present

## 2024-10-15 LAB — URINALYSIS, COMPLETE
Bilirubin, UA: NEGATIVE
Glucose, UA: NEGATIVE
Ketones, UA: NEGATIVE
Nitrite, UA: NEGATIVE
Protein,UA: NEGATIVE
RBC, UA: NEGATIVE
Specific Gravity, UA: 1.015 (ref 1.005–1.030)
Urobilinogen, Ur: 0.2 mg/dL (ref 0.2–1.0)
pH, UA: 7 (ref 5.0–7.5)

## 2024-10-15 LAB — MICROSCOPIC EXAMINATION

## 2024-10-15 NOTE — Progress Notes (Signed)
 "  10/15/2024 9:53 AM   Russell Jordan 07-18-1936 [id number]  Referring provider: Jeffie Cheryl BRAVO, MD [address] [place],  [place] [address]  Chief Complaint  Patient presents with   Benign Prostatic Hypertrophy   Urologic history: 1.  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  History gross hematuria Evaluation 2011 and 2018 with negative upper tract imaging; cystoscopy remarkable for BPH   3.  BPH with lower urinary tract symptoms Combination therapy with tamsulosin /dutasteride [name]   HPI: Russell Jordan is a 89 y.o. Jordan presents for annual follow-up.  No significant changes since last year's visit.  He has occasional small-volume urge incontinence and wears a pad when he is going to be out and not near a bathroom Denies dysuria, gross hematuria No flank, abdominal or pelvic pain Is taking dutasteride  3 times weekly, tamsulosin  every other day and Myrbetriq every other day   PMH: Past Medical History:  Diagnosis Date   Benign prostatic disease    Benign prostatic hyperplasia with urinary obstruction 08/01/2013   Cancer (HCC)    Carcinoma in situ of prostate 08/01/2013   Dysrhythmia    ED (erectile dysfunction) of organic origin 08/01/2013   Elevated prostate specific antigen (PSA) 08/01/2013   Erectile dysfunction    Essential hypertension 07/09/2014   Gastro-esophageal reflux disease without esophagitis 07/09/2014   GERD (gastroesophageal reflux disease)    Hemorrhoids    Hyperlipidemia    Incomplete emptying of bladder 09/14/2015   Malignant neoplasm of skin of nose 08/01/2013   Overview:  IMO Problem List Replacer Jan. 2016   Primary insomnia 07/09/2014   Primary osteoarthritis of right knee 01/24/2017    Surgical History: Past Surgical History:  Procedure  Laterality Date   CATARACT EXTRACTION W/PHACO Right 12/06/2020   Procedure: CATARACT EXTRACTION PHACO AND INTRAOCULAR LENS PLACEMENT (IOC) RIGHT 9.87 01:08.5 14.4%;  Surgeon: Mittie Gaskin, MD;  Location: [facility];  Service: Ophthalmology;  Laterality: Right;   CHOLECYSTECTOMY     ESOPHAGOGASTRODUODENOSCOPY (EGD) WITH PROPOFOL  N/A 08/25/2016   Procedure: ESOPHAGOGASTRODUODENOSCOPY (EGD) WITH PROPOFOL ;  Surgeon: Gladis RAYMOND Mariner, MD;  Location: [facility];  Service: Endoscopy;  Laterality: N/A;   SQUAMOUS CELL CARCINOMA EXCISION     TONSILLECTOMY      Home Medications:  Allergies as of 10/15/2024   No Known Allergies      Medication List        Accurate as of October 15, 2024  9:53 AM. If you have any questions, ask your nurse or doctor.          STOP taking these medications    dutasteride  0.5 MG capsule Commonly known as: AVODART        TAKE these medications    aspirin EC 81 MG tablet Take 81 mg by mouth 3 (three) times a week. What changed: Another medication with the same name was removed. Continue taking this medication, and follow the directions you see here.   atorvastatin 20 MG tablet Commonly known as: LIPITOR Take 20 mg by mouth daily.   cyanocobalamin 1000 MCG tablet Commonly known as: VITAMIN B12 Take 1,000 mcg by mouth daily.   losartan 50 MG tablet Commonly known as: COZAAR Take 50 mg by mouth daily. What changed: Another medication with the same name was removed. Continue taking this medication, and  follow the directions you see here.   metoprolol  tartrate 25 MG tablet Commonly known as: LOPRESSOR  TAKE 1 TABLET 2 HR PRIOR TO CARDIAC PROCEDURE   mirabegron ER 25 MG Tb24 tablet Commonly known as: MYRBETRIQ Take 25 mg by mouth every other day.   omeprazole 40 MG capsule Commonly known as: PRILOSEC Take 40 mg by mouth as needed.   omeprazole 20 MG capsule Commonly known as: PRILOSEC Take by mouth.   polyethylene glycol  17 g packet Commonly known as: MIRALAX / GLYCOLAX Take 17 g by mouth daily as needed.   SSD 1 % cream Generic drug: silver sulfADIAZINE   tamsulosin  0.4 MG Caps capsule Commonly known as: FLOMAX  Take 1 capsule (0.4 mg total) by mouth every other day.   TRAZODONE HCL PO Take 25 mg by mouth. Takes 1/2 a tab   trospium  20 MG tablet Commonly known as: SANCTURA  Take 1 tablet (20 mg total) by mouth 2 (two) times daily.        Allergies: Allergies[1]  Family History: No family history on file.  Social History:  reports that he quit smoking about 59 years ago. His smoking use included cigarettes. He has never used smokeless tobacco. He reports that he does not drink alcohol and does not use drugs.   Physical Exam: BP (!) 148/72   Pulse 77   Ht 5' 7 (1.702 m)   Wt 145 lb (65.8 kg)   BMI 22.71 kg/m   Constitutional:  Alert, No acute distress. HEENT: [place] AT Respiratory: Normal respiratory effort, no increased work of breathing. GU: Prostate 50 g, smooth without nodules or induration.  Stable superficial calcification, probable phleboliths Psychiatric: Normal mood and affect.   Assessment & Plan:    1.  BPH with LUTS Discussed increasing Myrbetriq to a daily dose if urge incontinence is bothersome or worsens Continue dutasteride /tamsulosin   2.  Hematuria UA today  3.  History elevated PSA Based on age and current prostate cancer screening guidelines recommend discontinuing PSA screening   Glendia JAYSON Barba, MD  [facility] [address] [place], [place] [address] [phone]    [1] No Known Allergies  "

## 2025-10-16 ENCOUNTER — Ambulatory Visit: Admitting: Urology
